# Patient Record
Sex: Female | Born: 1990 | Race: Black or African American | Hispanic: No | Marital: Single | Smoking: Former smoker
Health system: Southern US, Community
[De-identification: ages and names within clinical notes are randomized; demographics above are authoritative.]

## PROBLEM LIST (undated history)

## (undated) DIAGNOSIS — N730 Acute parametritis and pelvic cellulitis: Secondary | ICD-10-CM

## (undated) DIAGNOSIS — N870 Mild cervical dysplasia: Secondary | ICD-10-CM

## (undated) DIAGNOSIS — Z202 Contact with and (suspected) exposure to infections with a predominantly sexual mode of transmission: Secondary | ICD-10-CM

## (undated) HISTORY — DX: Mild cervical dysplasia: N87.0

## (undated) HISTORY — PX: NO PAST SURGERIES: SHX2092

---
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
## 2013-08-06 ENCOUNTER — Emergency Department (HOSPITAL_BASED_OUTPATIENT_CLINIC_OR_DEPARTMENT_OTHER)
Admission: EM | Admit: 2013-08-06 | Discharge: 2013-08-06 | Disposition: A | Discharge status: Home / Self Care | Payer: Medicaid Other | Attending: Emergency Medicine | Admitting: Emergency Medicine

## 2013-08-06 ENCOUNTER — Emergency Department (HOSPITAL_BASED_OUTPATIENT_CLINIC_OR_DEPARTMENT_OTHER): Payer: Medicaid Other

## 2013-08-06 ENCOUNTER — Encounter (HOSPITAL_BASED_OUTPATIENT_CLINIC_OR_DEPARTMENT_OTHER): Payer: Self-pay | Admitting: Emergency Medicine

## 2013-08-06 DIAGNOSIS — Y9389 Activity, other specified: Secondary | ICD-10-CM | POA: Diagnosis not present

## 2013-08-06 DIAGNOSIS — S8990XA Unspecified injury of unspecified lower leg, initial encounter: Secondary | ICD-10-CM | POA: Insufficient documentation

## 2013-08-06 DIAGNOSIS — S99919A Unspecified injury of unspecified ankle, initial encounter: Secondary | ICD-10-CM | POA: Diagnosis not present

## 2013-08-06 DIAGNOSIS — Y929 Unspecified place or not applicable: Secondary | ICD-10-CM | POA: Insufficient documentation

## 2013-08-06 DIAGNOSIS — S99929A Unspecified injury of unspecified foot, initial encounter: Principal | ICD-10-CM

## 2013-08-06 DIAGNOSIS — W450XXA Nail entering through skin, initial encounter: Secondary | ICD-10-CM

## 2013-08-06 DIAGNOSIS — IMO0002 Reserved for concepts with insufficient information to code with codable children: Secondary | ICD-10-CM | POA: Diagnosis not present

## 2013-08-06 NOTE — ED Notes (Signed)
Pt. Has noted broken R great toenail and it has lifted from the skin.  Pt. Reports stumping her toe causing the toenail to break.

## 2013-08-06 NOTE — ED Provider Notes (Signed)
CSN: [id number]     Arrival date & time 08/06/13  1230 History   First MD Initiated Contact with Patient 08/06/13 1406     Chief Complaint  Patient presents with  . Foot Pain     (Consider location/radiation/quality/duration/timing/severity/associated sxs/prior Treatment) HPI Comments: Pt states that a couple of months ago she dropped a box on her right great toe and the nail was black and blue underneath. Pt state that she hit her toe last night and the nail split half way down the nail bed. No bleeding or pain at this time. Denies drainage  The history is provided by the patient. No language interpreter was used.    History reviewed. No pertinent past medical history. History reviewed. No pertinent past surgical history. No family history on file. History  Substance Use Topics  . Smoking status: Never Smoker   . Smokeless tobacco: Not on file  . Alcohol Use: No   OB History   Grav Para Term Preterm Abortions TAB SAB Ect Mult Living                 Review of Systems  Constitutional: Negative.   Respiratory: Negative.   Cardiovascular: Negative.       Allergies  Review of patient's allergies indicates no known allergies.  Home Medications   Prior to Admission medications   Not on File   BP 119/44  Pulse 64  Temp(Src) 98.6 F (37 C) (Oral)  Resp 16  Ht 5\' 3"  (1.6 m)  Wt 147 lb (66.679 kg)  BMI 26.05 kg/m2  SpO2 100%  LMP 07/07/2013 Physical Exam  Nursing note and vitals reviewed. Constitutional: She is oriented to person, place, and time. She appears well-developed and well-nourished.  Cardiovascular: Normal rate and regular rhythm.   Pulmonary/Chest: Effort normal and breath sounds normal.  Musculoskeletal:  No drainage or redness noted to the nailbed. Pt has thick nail. Pt split horizontally about 1/2 way down  Neurological: She is alert and oriented to person, place, and time. Coordination normal.    ED Course  Procedures (including critical care  time) Labs Review Labs Reviewed - No data to display  Imaging Review Dg Toe Great Right  08/06/2013   CLINICAL DATA:  Crush injury to the right great toe 1 month ago with bruising and swelling.  EXAM: RIGHT GREAT TOE  COMPARISON:  None.  FINDINGS: No acute bony or joint abnormality is identified. No soft tissue gas collection or radiopaque foreign body is seen. The nail of the great toe appears partially detached.  IMPRESSION: Negative for acute bony or joint abnormality. The nail of the great toe appears partially detached.   Electronically Signed   By: Drusilla Kannerhomas  Dalessio M.D.   On: 08/06/2013 14:23     EKG Interpretation None      MDM   Final diagnoses:  Nail, injury by, initial encounter    Pt likely had injury to nailbed when she dropped the box on the area, making the nail bridle, no bony injury noted at this time    [name], NP 08/06/13 1430

## 2013-08-06 NOTE — ED Provider Notes (Signed)
Medical screening examination/treatment/procedure(s) were performed by non-physician practitioner and as supervising physician I was immediately available for consultation/collaboration.     Taneasha Fuqua, MD 08/06/13 1520 

## 2014-01-01 ENCOUNTER — Encounter (HOSPITAL_COMMUNITY): Payer: Self-pay | Admitting: Emergency Medicine

## 2014-01-01 ENCOUNTER — Emergency Department (HOSPITAL_COMMUNITY)
Admission: EM | Admit: 2014-01-01 | Discharge: 2014-01-01 | Disposition: A | Discharge status: Home / Self Care | Payer: Medicaid Other | Attending: Emergency Medicine | Admitting: Emergency Medicine

## 2014-01-01 DIAGNOSIS — S4992XA Unspecified injury of left shoulder and upper arm, initial encounter: Secondary | ICD-10-CM | POA: Diagnosis not present

## 2014-01-01 DIAGNOSIS — Y9389 Activity, other specified: Secondary | ICD-10-CM | POA: Insufficient documentation

## 2014-01-01 DIAGNOSIS — Y92410 Unspecified street and highway as the place of occurrence of the external cause: Secondary | ICD-10-CM | POA: Diagnosis not present

## 2014-01-01 DIAGNOSIS — S29002A Unspecified injury of muscle and tendon of back wall of thorax, initial encounter: Secondary | ICD-10-CM | POA: Insufficient documentation

## 2014-01-01 DIAGNOSIS — Y998 Other external cause status: Secondary | ICD-10-CM | POA: Diagnosis not present

## 2014-01-01 DIAGNOSIS — S199XXA Unspecified injury of neck, initial encounter: Secondary | ICD-10-CM | POA: Diagnosis not present

## 2014-01-01 DIAGNOSIS — S0990XA Unspecified injury of head, initial encounter: Secondary | ICD-10-CM | POA: Diagnosis present

## 2014-01-01 DIAGNOSIS — Z041 Encounter for examination and observation following transport accident: Secondary | ICD-10-CM

## 2014-01-01 MED ORDER — IBUPROFEN 400 MG PO TABS
800.0000 mg | ORAL_TABLET | Freq: Once | ORAL | Status: AC
  Administered 2014-01-01: 800 mg via ORAL
  Filled 2014-01-01: qty 2

## 2014-01-01 MED ORDER — METHOCARBAMOL 500 MG PO TABS: 500.0000 mg | ORAL_TABLET | Freq: Two times a day (BID) | ORAL | Status: DC

## 2014-01-01 MED ORDER — IBUPROFEN 800 MG PO TABS: 800.0000 mg | ORAL_TABLET | Freq: Three times a day (TID) | ORAL | Status: DC

## 2014-01-01 MED ORDER — DICLOFENAC SODIUM 1 % TD GEL: 2.0000 g | Freq: Four times a day (QID) | TRANSDERMAL | Status: DC
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
## 2014-04-16 DIAGNOSIS — Z8619 Personal history of other infectious and parasitic diseases: Secondary | ICD-10-CM | POA: Insufficient documentation

## 2014-08-30 ENCOUNTER — Encounter (HOSPITAL_COMMUNITY): Payer: Self-pay | Admitting: Emergency Medicine

## 2014-08-30 ENCOUNTER — Emergency Department (HOSPITAL_COMMUNITY)
Admission: EM | Admit: 2014-08-30 | Discharge: 2014-08-30 | Disposition: A | Discharge status: Home / Self Care | Payer: 59 | Attending: Emergency Medicine | Admitting: Emergency Medicine

## 2014-08-30 ENCOUNTER — Emergency Department (HOSPITAL_COMMUNITY): Payer: 59

## 2014-08-30 DIAGNOSIS — R102 Pelvic and perineal pain: Secondary | ICD-10-CM

## 2014-08-30 DIAGNOSIS — Z3202 Encounter for pregnancy test, result negative: Secondary | ICD-10-CM | POA: Diagnosis not present

## 2014-08-30 DIAGNOSIS — Z79899 Other long term (current) drug therapy: Secondary | ICD-10-CM | POA: Insufficient documentation

## 2014-08-30 DIAGNOSIS — R1030 Lower abdominal pain, unspecified: Secondary | ICD-10-CM | POA: Diagnosis present

## 2014-08-30 LAB — WET PREP, GENITAL
Trich, Wet Prep: NONE SEEN
Yeast Wet Prep HPF POC: NONE SEEN

## 2014-08-30 LAB — LIPASE, BLOOD: Lipase: 16 U/L — ABNORMAL LOW (ref 22–51)

## 2014-08-30 LAB — URINALYSIS, ROUTINE W REFLEX MICROSCOPIC
BILIRUBIN URINE: NEGATIVE
GLUCOSE, UA: NEGATIVE mg/dL
HGB URINE DIPSTICK: NEGATIVE
Ketones, ur: 15 mg/dL — AB
Leukocytes, UA: NEGATIVE
Nitrite: NEGATIVE
PH: 6 (ref 5.0–8.0)
Protein, ur: NEGATIVE mg/dL
SPECIFIC GRAVITY, URINE: 1.027 (ref 1.005–1.030)
UROBILINOGEN UA: 0.2 mg/dL (ref 0.0–1.0)

## 2014-08-30 LAB — COMPREHENSIVE METABOLIC PANEL
ALBUMIN: 3.6 g/dL (ref 3.5–5.0)
ALT: 16 U/L (ref 14–54)
ANION GAP: 10 (ref 5–15)
AST: 20 U/L (ref 15–41)
Alkaline Phosphatase: 60 U/L (ref 38–126)
BUN: 6 mg/dL (ref 6–20)
CO2: 25 mmol/L (ref 22–32)
Calcium: 9.2 mg/dL (ref 8.9–10.3)
Chloride: 103 mmol/L (ref 101–111)
Creatinine, Ser: 0.73 mg/dL (ref 0.44–1.00)
GFR calc Af Amer: >60 mL/min (ref >60)
Glucose, Bld: 93 mg/dL (ref 65–99)
POTASSIUM: 3.4 mmol/L — AB (ref 3.5–5.1)
Sodium: 138 mmol/L (ref 135–145)
Total Bilirubin: 0.5 mg/dL (ref 0.3–1.2)
Total Protein: 8.1 g/dL (ref 6.5–8.1)

## 2014-08-30 LAB — CBC
HCT: 37 % (ref 36.0–46.0)
Hemoglobin: 12.8 g/dL (ref 12.0–15.0)
MCH: 30.3 pg (ref 26.0–34.0)
MCHC: 34.6 g/dL (ref 30.0–36.0)
MCV: 87.5 fL (ref 78.0–100.0)
Platelets: 349 10*3/uL (ref 150–400)
RBC: 4.23 MIL/uL (ref 3.87–5.11)
RDW: 12.9 % (ref 11.5–15.5)
WBC: 7.8 10*3/uL (ref 4.0–10.5)

## 2014-08-30 LAB — POC URINE PREG, ED: Preg Test, Ur: NEGATIVE

## 2014-08-30 MED ORDER — KETOROLAC TROMETHAMINE 30 MG/ML IJ SOLN
15.0000 mg | Freq: Once | INTRAMUSCULAR | Status: AC
  Administered 2014-08-30: 15 mg via INTRAVENOUS
  Filled 2014-08-30: qty 1

## 2014-08-30 NOTE — Discharge Instructions (Signed)
Return to the Emergency Department in 12-24 hours for repeat abdominal exam if you develop new or worsening abdominal pain.  You can take ibuprofen, available over the counter, for pain.     Abdominal Pain, Women Abdominal (stomach, pelvic, or belly) pain can be caused by many things. It is important to tell your doctor:  The location of the pain.  Does it come and go or is it present all the time?  Are there things that start the pain (eating certain foods, exercise)?  Are there other symptoms associated with the pain (fever, nausea, vomiting, diarrhea)? All of this is helpful to know when trying to find the cause of the pain. CAUSES   Stomach: virus or bacteria infection, or ulcer.  Intestine: appendicitis (inflamed appendix), regional ileitis (Crohn's disease), ulcerative colitis (inflamed colon), irritable bowel syndrome, diverticulitis (inflamed diverticulum of the colon), or cancer of the stomach or intestine.  Gallbladder disease or stones in the gallbladder.  Kidney disease, kidney stones, or infection.  Pancreas infection or cancer.  Fibromyalgia (pain disorder).  Diseases of the female organs:  Uterus: fibroid (non-cancerous) tumors or infection.  Fallopian tubes: infection or tubal pregnancy.  Ovary: cysts or tumors.  Pelvic adhesions (scar tissue).  Endometriosis (uterus lining tissue growing in the pelvis and on the pelvic organs).  Pelvic congestion syndrome (female organs filling up with blood just before the menstrual period).  Pain with the menstrual period.  Pain with ovulation (producing an egg).  Pain with an IUD (intrauterine device, birth control) in the uterus.  Cancer of the female organs.  Functional pain (pain not caused by a disease, may improve without treatment).  Psychological pain.  Depression. DIAGNOSIS  Your doctor will decide the seriousness of your pain by doing an examination.  Blood tests.  X-rays.  Ultrasound.  CT  scan (computed tomography, special type of X-ray).  MRI (magnetic resonance imaging).  Cultures, for infection.  Barium enema (dye inserted in the large intestine, to better view it with X-rays).  Colonoscopy (looking in intestine with a lighted tube).  Laparoscopy (minor surgery, looking in abdomen with a lighted tube).  Major abdominal exploratory surgery (looking in abdomen with a large incision). TREATMENT  The treatment will depend on the cause of the pain.   Many cases can be observed and treated at home.  Over-the-counter medicines recommended by your caregiver.  Prescription medicine.  Antibiotics, for infection.  Birth control pills, for painful periods or for ovulation pain.  Hormone treatment, for endometriosis.  Nerve blocking injections.  Physical therapy.  Antidepressants.  Counseling with a psychologist or psychiatrist.  Minor or major surgery. HOME CARE INSTRUCTIONS   Do not take laxatives, unless directed by your caregiver.  Take over-the-counter pain medicine only if ordered by your caregiver. Do not take aspirin because it can cause an upset stomach or bleeding.  Try a clear liquid diet (broth or water) as ordered by your caregiver. Slowly move to a bland diet, as tolerated, if the pain is related to the stomach or intestine.  Have a thermometer and take your temperature several times a day, and record it.  Bed rest and sleep, if it helps the pain.  Avoid sexual intercourse, if it causes pain.  Avoid stressful situations.  Keep your follow-up appointments and tests, as your caregiver orders.  If the pain does not go away with medicine or surgery, you may try:  Acupuncture.  Relaxation exercises (yoga, meditation).  Group therapy.  Counseling. SEEK MEDICAL CARE IF:  You notice certain foods cause stomach pain.  Your home care treatment is not helping your pain.  You need stronger pain medicine.  You want your IUD  removed.  You feel faint or lightheaded.  You develop nausea and vomiting.  You develop a rash.  You are having side effects or an allergy to your medicine. SEEK IMMEDIATE MEDICAL CARE IF:   Your pain does not go away or gets worse.  You have a fever.  Your pain is felt only in portions of the abdomen. The right side could possibly be appendicitis. The left lower portion of the abdomen could be colitis or diverticulitis.  You are passing blood in your stools (bright red or black tarry stools, with or without vomiting).  You have blood in your urine.  You develop chills, with or without a fever.  You pass out. MAKE SURE YOU:   Understand these instructions.  Will watch your condition.  Will get help right away if you are not doing well or get worse. Document Released: 11/15/2006 Document Revised: 06/04/2013 Document Reviewed: 12/05/2008 [facility] Patient Information 2015 [place], [place]. This information is not intended to replace advice given to you by your health care provider. Make sure you discuss any questions you have with your health care provider.

## 2014-08-30 NOTE — ED Provider Notes (Signed)
CSN: [id number]     Arrival date & time 08/30/14  1410 History   First MD Initiated Contact with Patient 08/30/14 1627     Chief Complaint  Patient presents with  . Abdominal Pain     Patient is a 24 y.o. female presenting with abdominal pain. The history is provided by the patient. No language interpreter was used.  Abdominal Pain  Lorraine Williamson presents for evaluation of abdominal pain. She states that she's had lower abdominal cramping for the last 3 days. The cramping is worse with movement and urination. She denies any fevers, vomiting, diarrhea, and constipation. She does have nausea and decreased appetite. She states that the symptoms began after having intercourse. She has no vaginal discharge. Overall the symptoms have improved since they began. Symptoms are moderate, constant, worsening.  History reviewed. No pertinent past medical history. History reviewed. No pertinent past surgical history. No family history on file. History  Substance Use Topics  . Smoking status: Never Smoker   . Smokeless tobacco: Not on file  . Alcohol Use: No   OB History    No data available     Review of Systems  Gastrointestinal: Positive for abdominal pain.  All other systems reviewed and are negative.     Allergies  Review of patient's allergies indicates no known allergies.  Home Medications   Prior to Admission medications   Medication Sig Start Date End Date Taking? Authorizing Provider  acetaminophen (TYLENOL) 500 MG tablet Take 500 mg by mouth every 6 (six) hours as needed for mild pain or moderate pain.   Yes Historical Provider, MD  Multiple Vitamins-Minerals (MULTIVITAMIN & MINERAL PO) Take 1 tablet by mouth daily.   Yes Historical Provider, MD  diclofenac sodium (VOLTAREN) 1 % GEL Apply 2 g topically 4 (four) times daily. Patient not taking: Reported on 08/30/2014 01/01/14   Francee Piccolo, PA-C  ibuprofen (ADVIL,MOTRIN) 800 MG tablet Take 1 tablet (800 mg total) by mouth 3  (three) times daily. Patient not taking: Reported on 08/30/2014 01/01/14   Francee Piccolo, PA-C  methocarbamol (ROBAXIN) 500 MG tablet Take 1 tablet (500 mg total) by mouth 2 (two) times daily. Patient not taking: Reported on 08/30/2014 01/01/14   Victorino Dike Piepenbrink, PA-C   BP 130/76 mmHg  Pulse 101  Temp(Src) 98.9 F (37.2 C) (Oral)  Resp 16  Ht  (1.6 m)  Wt 159 lb (72.122 kg)  BMI 28.17 kg/m2  SpO2 100%  LMP 08/21/2014 Physical Exam  Constitutional: She is oriented to person, place, and time. She appears well-developed and well-nourished.  HENT:  Head: Normocephalic and atraumatic.  Cardiovascular: Normal rate and regular rhythm.   No murmur heard. Pulmonary/Chest: Effort normal and breath sounds normal. No respiratory distress.  Abdominal: Soft. There is no rebound and no guarding.  Moderate suprapubic and pelvic tenderness to palpation without any guarding or rebound  Genitourinary:  External vaginal/GU exam wnl without erythema or discharge.  Pt unable to tolerate speculum exam due to discomfort.  On bimanual exam she has some right adnexal tenderness, no CMT.    Musculoskeletal: She exhibits no edema or tenderness.  Neurological: She is alert and oriented to person, place, and time.  Skin: Skin is warm and dry.  Psychiatric: She has a normal mood and affect. Her behavior is normal.  Nursing note and vitals reviewed.   ED Course  Procedures (including critical care time) Labs Review Labs Reviewed  WET PREP, GENITAL - Abnormal; Notable for the following:  Clue Cells Wet Prep HPF POC FEW (*)    WBC, Wet Prep HPF POC FEW (*)    All other components within normal limits  LIPASE, BLOOD - Abnormal; Notable for the following:    Lipase 16 (*)    All other components within normal limits  COMPREHENSIVE METABOLIC PANEL - Abnormal; Notable for the following:    Potassium 3.4 (*)    All other components within normal limits  URINALYSIS, ROUTINE W REFLEX MICROSCOPIC  (NOT AT [facility]) - Abnormal; Notable for the following:    Ketones, ur 15 (*)    All other components within normal limits  CBC  POC URINE PREG, ED  GC/CHLAMYDIA PROBE AMP ([place]) NOT AT [facility]    Imaging Review US Transvaginal Non-ob  08/30/2014   CLINICAL DATA:  Midline pelvic pain, cramping for 5 days.  EXAM: TRANSABDOMINAL AND TRANSVAGINAL ULTRASOUND OF PELVIS  TECHNIQUE: Both transabdominal and transvaginal ultrasound examinations of the pelvis were performed. Transabdominal technique was performed for global imaging of the pelvis including uterus, ovaries, adnexal regions, and pelvic cul-de-sac. It was necessary to proceed with endovaginal exam following the transabdominal exam to visualize the uterus, endometrium, ovaries and adnexa .  COMPARISON:  None  FINDINGS: Uterus  Measurements: 5.7 x 3.1 x 4.2 cm. No fibroids or other mass visualized.  Endometrium  Thickness: 4 mm.  No focal abnormality visualized.  Right ovary  Measurements: 2.7 x 2.8 x 3.3 cm. Normal appearance/no adnexal mass. Small follicles.  Left ovary  Measurements: 2.9 x 1.8 x 1.7 cm. Normal appearance/no adnexal mass. Small follicles.  Other findings  No free fluid.  IMPRESSION: Unremarkable pelvic ultrasound.   Electronically Signed   By: Charlett Nose M.D.   On: 08/30/2014 19:40   US Pelvis Complete  08/30/2014   CLINICAL DATA:  Midline pelvic pain, cramping for 5 days.  EXAM: TRANSABDOMINAL AND TRANSVAGINAL ULTRASOUND OF PELVIS  TECHNIQUE: Both transabdominal and transvaginal ultrasound examinations of the pelvis were performed. Transabdominal technique was performed for global imaging of the pelvis including uterus, ovaries, adnexal regions, and pelvic cul-de-sac. It was necessary to proceed with endovaginal exam following the transabdominal exam to visualize the uterus, endometrium, ovaries and adnexa .  COMPARISON:  None  FINDINGS: Uterus  Measurements: 5.7 x 3.1 x 4.2 cm. No fibroids or other mass visualized.  Endometrium   Thickness: 4 mm.  No focal abnormality visualized.  Right ovary  Measurements: 2.7 x 2.8 x 3.3 cm. Normal appearance/no adnexal mass. Small follicles.  Left ovary  Measurements: 2.9 x 1.8 x 1.7 cm. Normal appearance/no adnexal mass. Small follicles.  Other findings  No free fluid.  IMPRESSION: Unremarkable pelvic ultrasound.   Electronically Signed   By: Charlett Nose M.D.   On: 08/30/2014 19:40     EKG Interpretation None      MDM   Final diagnoses:  Pelvic pain in female    Patient here for evaluation of pelvic pain. Examination is not consistent with PID or tubo-ovarian abscess. Pelvic ultrasound without any evidence of ovarian cyst or torsion. Examination and history is not consistent with appendicitis. Discussed with patient home care for pelvic pain with ibuprofen. Recommend close return precautions for repeat abdominal examination.   Tilden Fossa, MD 08/30/14 2120

## 2014-08-30 NOTE — ED Notes (Signed)
Onset 4-5 LLQ and RLQ abdominal pain states when urinating feels like having contractions LMP July 20 through July 23.

## 2014-09-02 LAB — GC/CHLAMYDIA PROBE AMP (~~LOC~~) NOT AT ARMC
CHLAMYDIA, DNA PROBE: POSITIVE — AB
NEISSERIA GONORRHEA: NEGATIVE

## 2014-09-04 ENCOUNTER — Telehealth (HOSPITAL_COMMUNITY): Payer: Self-pay

## 2014-09-04 ENCOUNTER — Emergency Department (HOSPITAL_BASED_OUTPATIENT_CLINIC_OR_DEPARTMENT_OTHER)
Admission: EM | Admit: 2014-09-04 | Discharge: 2014-09-04 | Disposition: A | Discharge status: Home / Self Care | Payer: 59 | Attending: Emergency Medicine | Admitting: Emergency Medicine

## 2014-09-04 ENCOUNTER — Encounter (HOSPITAL_BASED_OUTPATIENT_CLINIC_OR_DEPARTMENT_OTHER): Payer: Self-pay | Admitting: *Deleted

## 2014-09-04 DIAGNOSIS — Z79899 Other long term (current) drug therapy: Secondary | ICD-10-CM | POA: Diagnosis not present

## 2014-09-04 DIAGNOSIS — R1030 Lower abdominal pain, unspecified: Secondary | ICD-10-CM | POA: Diagnosis present

## 2014-09-04 DIAGNOSIS — A7489 Other chlamydial diseases: Secondary | ICD-10-CM | POA: Insufficient documentation

## 2014-09-04 DIAGNOSIS — R630 Anorexia: Secondary | ICD-10-CM | POA: Diagnosis not present

## 2014-09-04 DIAGNOSIS — A749 Chlamydial infection, unspecified: Secondary | ICD-10-CM

## 2014-09-04 MED ORDER — CEFTRIAXONE SODIUM 250 MG IJ SOLR
250.0000 mg | Freq: Once | INTRAMUSCULAR | Status: AC
  Administered 2014-09-04: 250 mg via INTRAMUSCULAR
  Filled 2014-09-04: qty 250

## 2014-09-04 MED ORDER — AZITHROMYCIN 250 MG PO TABS
1000.0000 mg | ORAL_TABLET | Freq: Once | ORAL | Status: AC
  Administered 2014-09-04: 1000 mg via ORAL
  Filled 2014-09-04: qty 4

## 2014-09-04 MED ORDER — DOXYCYCLINE HYCLATE 100 MG PO CAPS: 100.0000 mg | ORAL_CAPSULE | Freq: Two times a day (BID) | ORAL | Status: DC

## 2014-09-04 MED ORDER — LIDOCAINE HCL (PF) 1 % IJ SOLN
INTRAMUSCULAR | Status: AC
  Administered 2014-09-04: 1.2 mL
  Filled 2014-09-04: qty 5

## 2014-09-04 MED ORDER — FLUCONAZOLE 50 MG PO TABS
150.0000 mg | ORAL_TABLET | Freq: Once | ORAL | Status: AC
  Administered 2014-09-04: 150 mg via ORAL
  Filled 2014-09-04 (×2): qty 1

## 2014-09-04 NOTE — ED Provider Notes (Signed)
CSN: [id number]     Arrival date & time 09/04/14  1741 History   First MD Initiated Contact with Patient 09/04/14 1749     Chief Complaint  Patient presents with  . Abdominal Pain     (Consider location/radiation/quality/duration/timing/severity/associated sxs/prior Treatment) HPI Comments: 24 year old female complaining of continued lower abdominal pain since being seen in the ED on 08/30/2014. No new pain or symptoms, nothing worsening, just states it has not gone away despite taking ibuprofen which provides only temporary relief. About 8 days ago, pt started experiencing lower abdominal pain that began after having intercourse. She has not had intercourse since. While in the ER, she had a pelvic exam that was very painful with insertion of the speculum, a normal pelvic ultrasound and no concerns for PID. It is noted on chart review that her chlamydia culture is positive that was obtained at that visit. Patient has not yet been contacted about this. Denies vaginal bleeding or discharge. Endorses decreased appetite, was able to eat a piece of pizza today. Denies fever, chills, nausea, vomiting or diarrhea. Reports having one current sexual partner that she uses protection with, about one month ago, had unprotected intercourse with a new partner.  Patient is a 24 y.o. female presenting with abdominal pain. The history is provided by the patient and medical records.  Abdominal Pain   History reviewed. No pertinent past medical history. History reviewed. No pertinent past surgical history. No family history on file. History  Substance Use Topics  . Smoking status: Never Smoker   . Smokeless tobacco: Not on file  . Alcohol Use: No   OB History    No data available     Review of Systems  Constitutional: Positive for appetite change.  Gastrointestinal: Positive for abdominal pain.  All other systems reviewed and are negative.     Allergies  Review of patient's allergies indicates no  known allergies.  Home Medications   Prior to Admission medications   Medication Sig Start Date End Date Taking? Authorizing Provider  acetaminophen (TYLENOL) 500 MG tablet Take 500 mg by mouth every 6 (six) hours as needed for mild pain or moderate pain.    Historical Provider, MD  diclofenac sodium (VOLTAREN) 1 % GEL Apply 2 g topically 4 (four) times daily. Patient not taking: Reported on 08/30/2014 01/01/14   Francee Piccolo, PA-C  doxycycline (VIBRAMYCIN) 100 MG capsule Take 1 capsule (100 mg total) by mouth 2 (two) times daily. 09/04/14   Kathrynn Speed, PA-C  ibuprofen (ADVIL,MOTRIN) 800 MG tablet Take 1 tablet (800 mg total) by mouth 3 (three) times daily. Patient not taking: Reported on 08/30/2014 01/01/14   Francee Piccolo, PA-C  methocarbamol (ROBAXIN) 500 MG tablet Take 1 tablet (500 mg total) by mouth 2 (two) times daily. Patient not taking: Reported on 08/30/2014 01/01/14   Francee Piccolo, PA-C  Multiple Vitamins-Minerals (MULTIVITAMIN & MINERAL PO) Take 1 tablet by mouth daily.    Historical Provider, MD   BP 120/68 mmHg  Pulse 110  Temp(Src) 98.6 F (37 C) (Oral)  Resp 18  Ht  (1.6 m)  Wt 155 lb (70.308 kg)  BMI 27.46 kg/m2  SpO2 98%  LMP 08/21/2014 Physical Exam  Constitutional: She is oriented to person, place, and time. She appears well-developed and well-nourished. No distress.  HENT:  Head: Normocephalic and atraumatic.  Mouth/Throat: Oropharynx is clear and moist.  Eyes: Conjunctivae and EOM are normal.  Neck: Normal range of motion. Neck supple.  Cardiovascular: Normal rate, regular  rhythm and normal heart sounds.   Pulmonary/Chest: Effort normal and breath sounds normal. No respiratory distress.  Abdominal: Soft. Bowel sounds are normal. She exhibits no distension. There is no rebound and no guarding.  Mild suprapubic tenderness. No peritoneal signs.  Genitourinary:  Deferred.  Musculoskeletal: Normal range of motion. She exhibits no edema.   Neurological: She is alert and oriented to person, place, and time. No sensory deficit.  Skin: Skin is warm and dry.  Psychiatric: She has a normal mood and affect. Her behavior is normal.  Nursing note and vitals reviewed.   ED Course  Procedures (including critical care time) Labs Review Labs Reviewed - No data to display  Imaging Review No results found.   EKG Interpretation None      MDM   Final diagnoses:  Chlamydia  Lower abdominal pain   Non-toxic appearing, NAD. AFVSS. Noted to be tachycardic on arrival, no tachycardia on my exam. Abdomen is soft with no peritoneal signs. Already had pelvic exam with cultures along with normal pelvic US at last visit. No new symptoms or worsening pain. Discussed positive chlamydia with pt. Will treat with rocephin/azithro in ED, d/c home with 14 days of doxy. Safe sexual practices discussed. Pain most likely from developing PID from chlamydia. Infection/care precautions discussed. F/u with GYN. Stable for d/c. Return precautions given. Patient states understanding of treatment care plan and is agreeable.  Kathrynn Speed, PA-C 09/04/14 1821  Raeford Razor, MD 09/04/14 2037

## 2014-09-04 NOTE — ED Notes (Signed)
Pt c/o lower abd pain and no appetite x7 days. Pt was seen at [facility] ER on 7/30 and was told to f/u with the ER if not feeling better. Pt also c/o HA.

## 2014-09-04 NOTE — Telephone Encounter (Signed)
Results received from [facility].  (+) Chlamydia. No antibiotic treatment or Prescription given for STD.  Chart to MD office for review.  [facility]HS form attached. 

## 2014-09-04 NOTE — Discharge Instructions (Signed)
Take doxycycline twice daily for 14 days. It is important to complete the entire course of antibiotic. No sexual intercourse for 10 days after completing antibiotics. Your obligated to inform your partners so they can be treated for chlamydia.  Chlamydia Chlamydia is an infection. It is spread through sexual contact. Chlamydia can be in different areas of the body. These areas include the cervix, urethra, throat, or rectum. You may not know you have chlamydia because many people never develop the symptoms. Chlamydia is not difficult to treat once you know you have it. However, if it is left untreated, chlamydia can lead to more serious health problems.  CAUSES  Chlamydia is caused by bacteria. It is a sexually transmitted disease. It is passed from an infected partner during intimate contact. This contact could be with the genitals, mouth, or rectal area. Chlamydia can also be passed from mothers to babies during birth. SIGNS AND SYMPTOMS  There may not be any symptoms. This is often the case early in the infection. If symptoms develop, they may include:  Mild pain and discomfort when urinating.  Redness, soreness, and swelling (inflammation) of the rectum.  Vaginal discharge.  Painful intercourse.  Abdominal pain.  Bleeding between menstrual periods. DIAGNOSIS  To diagnose this infection, your health care provider will do a pelvic exam. Cultures will be taken of the vagina, cervix, urine, and possibly the rectum to verify the diagnosis.  TREATMENT You will be given antibiotic medicines. If you are pregnant, certain types of antibiotics will need to be avoided. Any sexual partners should also be treated, even if they do not show symptoms.  HOME CARE INSTRUCTIONS   Take your antibiotic medicine as directed by your health care provider. Finish the antibiotic even if you start to feel better.  Take medicines only as directed by your health care provider.  Inform any sexual partners about  the infection. They should also be treated.  Do not have sexual contact until your health care provider tells you it is okay.  Get plenty of rest.  Eat a well-balanced diet.  Drink enough fluids to keep your urine clear or pale yellow.  Keep all follow-up visits as directed by your health care provider. SEEK MEDICAL CARE IF:  You have painful urination.  You have abdominal pain.  You have vaginal discharge.  You have painful sexual intercourse.  You have bleeding between periods and after sex.  You have a fever. SEEK IMMEDIATE MEDICAL CARE IF:   You experience nausea or vomiting.  You experience excessive sweating (diaphoresis).  You have difficulty swallowing. MAKE SURE YOU:   Understand these instructions.  Will watch your condition.  Will get help right away if you are not doing well or get worse. Document Released: 10/28/2004 Document Revised: 06/04/2013 Document Reviewed: 09/25/2012 [facility] Patient Information 2015 [place], [place]. This information is not intended to replace advice given to you by your health care provider. Make sure you discuss any questions you have with your health care provider. Pelvic Inflammatory Disease Pelvic inflammatory disease (PID) refers to an infection in some or all of the female organs. The infection can be in the uterus, ovaries, fallopian tubes, or the surrounding tissues in the pelvis. PID can cause abdominal or pelvic pain that comes on suddenly (acute pelvic pain). PID is a serious infection because it can lead to lasting (chronic) pelvic pain or the inability to have children (infertile).  CAUSES  The infection is often caused by the normal bacteria found in the vaginal  tissues. PID may also be caused by an infection that is spread during sexual contact. PID can also occur following:   The birth of a baby.   A miscarriage.   An abortion.   Major pelvic surgery.   The use of an intrauterine device (IUD).   A  sexual assault.  RISK FACTORS Certain factors can put a person at higher risk for PID, such as:  Being younger than 25 years.  Being sexually active at [place] age.  Usingnonbarrier contraception.  Havingmultiple sexual partners.  Having sex with someone who has symptoms of a genital infection.  Using oral contraception. Other times, certain behaviors can increase the possibility of getting PID, such as:  Having sex during your period.  Using a vaginal douche.  Having an intrauterine device (IUD) in place. SYMPTOMS   Abdominal or pelvic pain.   Fever.   Chills.   Abnormal vaginal discharge.  Abnormal uterine bleeding.   Unusual pain shortly after finishing your period. DIAGNOSIS  Your caregiver will choose some of the following methods to make a diagnosis, such as:   Performinga physical exam and history. A pelvic exam typically reveals a very tender uterus and surrounding pelvis.   Ordering laboratory tests including a pregnancy test, blood tests, and urine test.  Orderingcultures of the vagina and cervix to check for a sexually transmitted infection (STI).  Performing an ultrasound.   Performing a laparoscopic procedure to look inside the pelvis.  TREATMENT   Antibiotic medicines may be prescribed and taken by mouth.   Sexual partners may be treated when the infection is caused by a sexually transmitted disease (STD).   Hospitalization may be needed to give antibiotics intravenously.  Surgery may be needed, but this is rare. It may take weeks until you are completely well. If you are diagnosed with PID, you should also be checked for human immunodeficiency virus (HIV). HOME CARE INSTRUCTIONS   If given, take your antibiotics as directed. Finish the medicine even if you start to feel better.   Only take over-the-counter or prescription medicines for pain, discomfort, or fever as directed by your caregiver.   Do not have sexual  intercourse until treatment is completed or as directed by your caregiver. If PID is confirmed, your recent sexual partner(s) will need treatment.   Keep your follow-up appointments. SEEK MEDICAL CARE IF:   You have increased or abnormal vaginal discharge.   You need prescription medicine for your pain.   You vomit.   You cannot take your medicines.   Your partner has an STD.  SEEK IMMEDIATE MEDICAL CARE IF:   You have a fever.   You have increased abdominal or pelvic pain.   You have chills.   You have pain when you urinate.   You are not better after 72 hours following treatment.  MAKE SURE YOU:   Understand these instructions.  Will watch your condition.  Will get help right away if you are not doing well or get worse. Document Released: 01/18/2005 Document Revised: 05/15/2012 Document Reviewed: 01/14/2011 [facility] Patient Information 2015 [place], [place]. This information is not intended to replace advice given to you by your health care provider. Make sure you discuss any questions you have with your health care provider.

## 2014-10-04 ENCOUNTER — Ambulatory Visit: Payer: 59 | Admitting: Obstetrics and Gynecology

## 2014-10-04 ENCOUNTER — Encounter: Payer: Self-pay | Admitting: Obstetrics and Gynecology

## 2014-10-04 NOTE — Progress Notes (Deleted)
   Subjective:    Patient ID: Lorraine Williamson, female    DOB: August 11, 1990, 24 y.o.   MRN: [id number]  HPI 24 yo presenting today as an ED follow up from 09/04/2014 for which she was treated for PID. Patient reports ***  History reviewed. No pertinent past medical history. No past surgical history on file. No family history on file. Social History  Substance Use Topics  . Smoking status: Never Smoker   . Smokeless tobacco: None  . Alcohol Use: No      Review of Systems See pertinent in HPI    Objective:   Physical Exam  GENERAL: Well-developed, well-nourished female in no acute distress.  ABDOMEN: Soft, nontender, nondistended. No organomegaly. PELVIC: Normal external female genitalia. Vagina is pink and rugated.  Normal discharge. Normal appearing cervix. Uterus is normal in size. No adnexal mass or tenderness. EXTREMITIES: No cyanosis, clubbing, or edema, 2+ distal pulses.       Assessment & Plan:  A/P 24 yo recently treated for PID here for follow up -

## 2014-10-04 NOTE — Progress Notes (Signed)
Patient ID: Lorraine Williamson, female   DOB: 07/24/1990, 24 y.o.   MRN: [id number] Patient was not seen by a provider today

## 2014-10-24 ENCOUNTER — Ambulatory Visit (INDEPENDENT_AMBULATORY_CARE_PROVIDER_SITE_OTHER): Payer: 59 | Admitting: Family Medicine

## 2014-10-24 ENCOUNTER — Encounter: Payer: Self-pay | Admitting: Family Medicine

## 2014-10-24 VITALS — BP 127/74 | HR 90 | Temp 98.9°F | Ht 64.0 in | Wt 162.7 lb

## 2014-10-24 DIAGNOSIS — Z113 Encounter for screening for infections with a predominantly sexual mode of transmission: Secondary | ICD-10-CM | POA: Diagnosis not present

## 2014-10-24 DIAGNOSIS — Z202 Contact with and (suspected) exposure to infections with a predominantly sexual mode of transmission: Secondary | ICD-10-CM | POA: Diagnosis not present

## 2014-10-24 DIAGNOSIS — Z118 Encounter for screening for other infectious and parasitic diseases: Secondary | ICD-10-CM | POA: Diagnosis not present

## 2014-10-24 NOTE — Progress Notes (Signed)
Lorraine Williamson here to assure CT resolved.  Patient was here for test of cure and was not seen by MD. I spoke with RN about the plan.  Federico Flake, MD

## 2014-10-25 LAB — URINE CYTOLOGY ANCILLARY ONLY
Chlamydia: NEGATIVE
Neisseria Gonorrhea: NEGATIVE

## 2014-11-05 ENCOUNTER — Telehealth: Payer: Self-pay | Admitting: *Deleted

## 2014-11-05 NOTE — Telephone Encounter (Signed)
Pt called requesting results of gc/ch. Informed patient of normal results. She had no further questions.

## 2015-03-12 ENCOUNTER — Ambulatory Visit (INDEPENDENT_AMBULATORY_CARE_PROVIDER_SITE_OTHER): Payer: Self-pay | Admitting: General Practice

## 2015-03-12 DIAGNOSIS — Z113 Encounter for screening for infections with a predominantly sexual mode of transmission: Secondary | ICD-10-CM

## 2015-03-13 LAB — GC/CHLAMYDIA PROBE AMP (~~LOC~~) NOT AT ARMC
Chlamydia: NEGATIVE
Neisseria Gonorrhea: NEGATIVE

## 2015-03-19 ENCOUNTER — Telehealth: Payer: Self-pay | Admitting: *Deleted

## 2015-03-19 NOTE — Telephone Encounter (Signed)
Pt called requesting std testing results. Called patient back and informed her of her results. She requested to make an appointment to be seen for followup because she thinks she might have an infection. Advised patient that i will send a message to front office staff.

## 2015-04-16 ENCOUNTER — Encounter: Payer: Self-pay | Admitting: Family Medicine

## 2015-04-16 ENCOUNTER — Ambulatory Visit (INDEPENDENT_AMBULATORY_CARE_PROVIDER_SITE_OTHER): Payer: BLUE CROSS/BLUE SHIELD | Admitting: Family Medicine

## 2015-04-16 VITALS — BP 109/71 | HR 92 | Temp 98.7°F | Wt 181.3 lb

## 2015-04-16 DIAGNOSIS — Z8619 Personal history of other infectious and parasitic diseases: Secondary | ICD-10-CM | POA: Diagnosis not present

## 2015-04-16 DIAGNOSIS — N760 Acute vaginitis: Secondary | ICD-10-CM | POA: Diagnosis not present

## 2015-04-16 DIAGNOSIS — A499 Bacterial infection, unspecified: Secondary | ICD-10-CM | POA: Diagnosis not present

## 2015-04-16 DIAGNOSIS — Z113 Encounter for screening for infections with a predominantly sexual mode of transmission: Secondary | ICD-10-CM

## 2015-04-16 DIAGNOSIS — B9689 Other specified bacterial agents as the cause of diseases classified elsewhere: Secondary | ICD-10-CM

## 2015-04-16 NOTE — Patient Instructions (Signed)

## 2015-04-16 NOTE — Progress Notes (Signed)
Patient ID: Lorraine Williamson, female   DOB: 04/24/1990, 25 y.o.   MRN: [id number]   CLINIC ENCOUNTER NOTE  History:  25 y.o. No obstetric history on file. here today for vaginal discomfort. Reports feeling pressure. Mild odor. Reports chlamydia 1 year ago.  Reports vaginal dryness. No new partners and sexually active with only one partner. She denies any abnormal vaginal discharge, bleeding, pelvic pain or other concerns.   No past medical history on file.  No past surgical history on file.  The following portions of the patient's history were reviewed and updated as appropriate: allergies, current medications, past family history, past medical history, past social history, past surgical history and problem list.   Health Maintenance:  Needs pap smear.   Review of Systems:  Pertinent items noted in HPI and remainder of comprehensive ROS otherwise negative.  Objective:  Physical Exam BP 109/71 mmHg  Pulse 92  Temp(Src) 98.7 F (37.1 C)  Wt 181 lb 4.8 oz (82.237 kg) CONSTITUTIONAL: Well-developed, well-nourished female in no acute distress.  HENT:  Normocephalic, atraumatic. External right and left ear normal. Oropharynx is clear and moist EYES: Conjunctivae and EOM are normal. Pupils are equal, round, and reactive to light. No scleral icterus.  NECK: Normal range of motion, supple, no masses SKIN: Skin is warm and dry. No rash noted. Not diaphoretic. No erythema. No pallor. NEUROLGIC: Alert and oriented to person, place, and time. Normal reflexes, muscle tone coordination. No cranial nerve deficit noted. PSYCHIATRIC: Normal mood and affect. Normal behavior. Normal judgment and thought content. CARDIOVASCULAR: Normal heart rate noted RESPIRATORY: Effort and breath sounds normal, no problems with respiration noted ABDOMEN: Soft, no distention noted.   PELVIC: Normal appearing external genitalia; normal appearing vaginal mucosa Patient id not tolerate speculum insertion and wet prep and  GC/CT were collected blindly.  MUSCULOSKELETAL: Normal range of motion. No edema noted.  Labs and Imaging No results found.  Assessment & Plan:   #vaginal discharge - collected cultures today, follow up results. Most likely BV - Sent in metronidazole given results of wet prep  #HCM - needs pap smear and declined this today given discomfort with speculum placement.   Routine preventative health maintenance measures emphasized. Please refer to After Visit Summary for other counseling recommendations.   Total face-to-face time with patient: 25 minutes. Over 50% of encounter was spent on counseling and coordination of care.

## 2015-04-17 LAB — WET PREP, GENITAL
Trich, Wet Prep: NONE SEEN
Yeast Wet Prep HPF POC: NONE SEEN

## 2015-04-17 LAB — GC/CHLAMYDIA PROBE AMP (~~LOC~~) NOT AT ARMC
Chlamydia: NEGATIVE
NEISSERIA GONORRHEA: NEGATIVE

## 2015-04-17 MED ORDER — METRONIDAZOLE 500 MG PO TABS: 500.0000 mg | ORAL_TABLET | Freq: Two times a day (BID) | ORAL | Status: DC

## 2015-04-18 ENCOUNTER — Telehealth: Payer: Self-pay | Admitting: General Practice

## 2015-04-18 NOTE — Telephone Encounter (Signed)
Per Dr Alvester MorinNewton, patient has BV and needs flagyl sent to pharmacy. Called patient & informed her of results and medication sent to pharmacy. Patient verbalized understanding & had no questions

## 2015-08-20 ENCOUNTER — Encounter (HOSPITAL_COMMUNITY): Payer: Self-pay | Admitting: *Deleted

## 2015-08-20 ENCOUNTER — Telehealth: Payer: Self-pay | Admitting: *Deleted

## 2015-08-20 ENCOUNTER — Inpatient Hospital Stay (HOSPITAL_COMMUNITY)
Admission: AD | Admit: 2015-08-20 | Discharge: 2015-08-20 | Disposition: A | Discharge status: Home / Self Care | Payer: BLUE CROSS/BLUE SHIELD | Source: Ambulatory Visit | Attending: Obstetrics & Gynecology | Admitting: Obstetrics & Gynecology

## 2015-08-20 DIAGNOSIS — Z113 Encounter for screening for infections with a predominantly sexual mode of transmission: Secondary | ICD-10-CM

## 2015-08-20 DIAGNOSIS — R110 Nausea: Secondary | ICD-10-CM | POA: Diagnosis not present

## 2015-08-20 DIAGNOSIS — N760 Acute vaginitis: Secondary | ICD-10-CM | POA: Diagnosis not present

## 2015-08-20 DIAGNOSIS — N739 Female pelvic inflammatory disease, unspecified: Secondary | ICD-10-CM | POA: Diagnosis not present

## 2015-08-20 DIAGNOSIS — Z114 Encounter for screening for human immunodeficiency virus [HIV]: Secondary | ICD-10-CM | POA: Diagnosis not present

## 2015-08-20 DIAGNOSIS — N898 Other specified noninflammatory disorders of vagina: Secondary | ICD-10-CM | POA: Diagnosis present

## 2015-08-20 DIAGNOSIS — Z3202 Encounter for pregnancy test, result negative: Secondary | ICD-10-CM | POA: Insufficient documentation

## 2015-08-20 DIAGNOSIS — Z8619 Personal history of other infectious and parasitic diseases: Secondary | ICD-10-CM | POA: Diagnosis not present

## 2015-08-20 HISTORY — DX: Contact with and (suspected) exposure to infections with a predominantly sexual mode of transmission: Z20.2

## 2015-08-20 HISTORY — DX: Acute parametritis and pelvic cellulitis: N73.0

## 2015-08-20 LAB — URINALYSIS, ROUTINE W REFLEX MICROSCOPIC
BILIRUBIN URINE: NEGATIVE
GLUCOSE, UA: NEGATIVE mg/dL
Hgb urine dipstick: NEGATIVE
KETONES UR: NEGATIVE mg/dL
Leukocytes, UA: NEGATIVE
Nitrite: NEGATIVE
PH: 5.5 (ref 5.0–8.0)
PROTEIN: NEGATIVE mg/dL
Specific Gravity, Urine: <1.005 — ABNORMAL LOW (ref 1.005–1.030)

## 2015-08-20 LAB — WET PREP, GENITAL
CLUE CELLS WET PREP: NONE SEEN
SPERM: NONE SEEN
TRICH WET PREP: NONE SEEN
YEAST WET PREP: NONE SEEN

## 2015-08-20 LAB — CBC
HCT: 38.6 % (ref 36.0–46.0)
Hemoglobin: 13.3 g/dL (ref 12.0–15.0)
MCH: 30.4 pg (ref 26.0–34.0)
MCHC: 34.5 g/dL (ref 30.0–36.0)
MCV: 88.1 fL (ref 78.0–100.0)
Platelets: 373 K/uL (ref 150–400)
RBC: 4.38 MIL/uL (ref 3.87–5.11)
RDW: 13 % (ref 11.5–15.5)
WBC: 4.6 K/uL (ref 4.0–10.5)

## 2015-08-20 LAB — POCT PREGNANCY, URINE: Preg Test, Ur: NEGATIVE

## 2015-08-20 MED ORDER — NYSTATIN-TRIAMCINOLONE 100000-0.1 UNIT/GM-% EX CREA: TOPICAL_CREAM | CUTANEOUS | Status: DC

## 2015-08-20 NOTE — Discharge Instructions (Signed)

## 2015-08-20 NOTE — MAU Note (Signed)
Pt presents to MAU with complaints of itching with some vaginal bleeding yesterday after intercourse with partner on July the 16th. Noticed swelling and irritation this morning on her vaginal area. Reports previous PID approximately a year ago

## 2015-08-20 NOTE — Telephone Encounter (Signed)
See note for phone encounter. 

## 2015-08-20 NOTE — MAU Provider Note (Signed)
History     CSN: [id number]  Arrival date and time: 08/20/15 7829   First Provider Initiated Contact with Patient 08/20/15 1043      Chief Complaint  Patient presents with  . vaginal irritation   . Vaginal Itching   HPI  Lorraine Williamson is a 25 y.o. G0P0000 female who presents for vaginal irritation. PMH significant for chlamydia & PID last year. Pt recently had intercourse with same partner who gave her chlamydia and is now concerned that she has chlamydia and/or PID again. Last intercourse 7/16; reports some pink spotting after intercourse, none since then. Reports vaginal itching & irritation x 3 days. Denies vaginal bleeding, vaginal discharge, fever/chills, abdominal pain, or dysuria.  Requesting STD testing today.   OB History    Gravida Para Term Preterm AB TAB SAB Ectopic Multiple Living        Past Medical History  Diagnosis Date  . Chlamydia contact, treated   . PID (acute pelvic inflammatory disease)     Past Surgical History  Procedure Laterality Date  . No past surgeries      No family history on file.  Social History  Substance Use Topics  . Smoking status: Never Smoker   . Smokeless tobacco: Not on file  . Alcohol Use: No    Allergies: No Known Allergies  Prescriptions prior to admission  Medication Sig Dispense Refill Last Dose  . acetaminophen (TYLENOL) 500 MG tablet Take 500 mg by mouth every 6 (six) hours as needed for mild pain or moderate pain. Reported on 04/16/2015   Not Taking  . diclofenac sodium (VOLTAREN) 1 % GEL Apply 2 g topically 4 (four) times daily. (Patient not taking: Reported on 08/30/2014) 100 g 0 Not Taking  . doxycycline (VIBRAMYCIN) 100 MG capsule Take 1 capsule (100 mg total) by mouth 2 (two) times daily. (Patient not taking: Reported on 10/24/2014) 28 capsule 0 Not Taking  . ibuprofen (ADVIL,MOTRIN) 800 MG tablet Take 1 tablet (800 mg total) by mouth 3 (three) times daily. (Patient not taking: Reported on  08/30/2014) 21 tablet 0 Not Taking  . methocarbamol (ROBAXIN) 500 MG tablet Take 1 tablet (500 mg total) by mouth 2 (two) times daily. (Patient not taking: Reported on 08/30/2014) 20 tablet 0 Not Taking  . metroNIDAZOLE (FLAGYL) 500 MG tablet Take 1 tablet (500 mg total) by mouth 2 (two) times daily. 14 tablet 0   . Multiple Vitamins-Minerals (MULTIVITAMIN & MINERAL PO) Take 1 tablet by mouth daily. Reported on 04/16/2015   Taking    Review of Systems  Constitutional: Negative for fever and chills.  Gastrointestinal: Positive for nausea. Negative for vomiting, abdominal pain, diarrhea and constipation.  Genitourinary: Negative for dysuria.       + vaginal irritation & itching No vaginal bleeding or discharge   Physical Exam   Blood pressure 113/71, pulse 58, temperature 98.6 F (37 C), resp. rate 18, weight 168 lb (76.204 kg), last menstrual period 08/11/2015.  Physical Exam  Nursing note and vitals reviewed. Constitutional: She is oriented to person, place, and time. She appears well-developed and well-nourished. No distress.  HENT:  Head: Normocephalic and atraumatic.  Eyes: Conjunctivae are normal. Right eye exhibits no discharge. Left eye exhibits no discharge. No scleral icterus.  Neck: Normal range of motion.  Respiratory: Effort normal. No respiratory distress.  GI: Soft. She exhibits no distension. There is no tenderness.  Genitourinary: Uterus normal. Cervix exhibits no motion  tenderness and no friability. Right adnexum displays no mass, no tenderness and no fullness. Left adnexum displays no mass, no tenderness and no fullness. There is erythema in the vagina. No bleeding in the vagina. Vaginal discharge (small amount of tan mucoid discharge) found.  Bilateral labia minora erythematous  Neurological: She is alert and oriented to person, place, and time.  Skin: Skin is warm and dry. She is not diaphoretic.  Psychiatric: She has a normal mood and affect. Her behavior is normal.  Judgment and thought content normal.    MAU Course  Procedures Results for orders placed or performed during the hospital encounter of 08/20/15 (from the past 24 hour(s))  Urinalysis, Routine w reflex microscopic ([facility])     Status: Abnormal   Collection Time: 08/20/15 10:00 AM  Result Value Ref Range   Color, Urine YELLOW YELLOW   APPearance CLEAR CLEAR   Specific Gravity, Urine <1.005 (L) 1.005 - 1.030   pH 5.5 5.0 - 8.0   Glucose, UA NEGATIVE NEGATIVE mg/dL   Hgb urine dipstick NEGATIVE NEGATIVE   Bilirubin Urine NEGATIVE NEGATIVE   Ketones, ur NEGATIVE NEGATIVE mg/dL   Protein, ur NEGATIVE NEGATIVE mg/dL   Nitrite NEGATIVE NEGATIVE   Leukocytes, UA NEGATIVE NEGATIVE  Pregnancy, urine POC     Status: None   Collection Time: 08/20/15 10:15 AM  Result Value Ref Range   Preg Test, Ur NEGATIVE NEGATIVE  Wet prep, genital     Status: Abnormal   Collection Time: 08/20/15 11:10 AM  Result Value Ref Range   Yeast Wet Prep HPF POC NONE SEEN NONE SEEN   Trich, Wet Prep NONE SEEN NONE SEEN   Clue Cells Wet Prep HPF POC NONE SEEN NONE SEEN   WBC, Wet Prep HPF POC FEW (A) NONE SEEN   Sperm NONE SEEN   CBC     Status: None   Collection Time: 08/20/15 11:19 AM  Result Value Ref Range   WBC 4.6 4.0 - 10.5 K/uL   RBC 4.38 3.87 - 5.11 MIL/uL   Hemoglobin 13.3 12.0 - 15.0 g/dL   HCT 54.038.6 98.136.0 - 19.146.0 %   MCV 88.1 78.0 - 100.0 fL   MCH 30.4 26.0 - 34.0 pg   MCHC 34.5 30.0 - 36.0 g/dL   RDW 47.813.0 29.511.5 - 62.115.5 %   Platelets 373 150 - 400 K/uL    MDM UPT negative No evidence of PID today CBC, HIV, RPR, GC/CT, wet prep  Assessment and Plan  A: 1. Screening for STD (sexually transmitted disease)   2. Vulvovaginitis     P: Discharge home Rx mycolog cream GC/CT, HIV, RPR pending   Judeth Hornrin Sharita Bienaime 08/20/2015, 10:22 AM

## 2015-08-20 NOTE — MAU Provider Note (Signed)
MAU SOAP NOTE  Subjective - CC: Lorraine Williamson is a 25 year old female, G0P0 is a history of chlamydia who presents with 4 days of vaginal itching and of vaginal irritation that started yesterday. Symptoms started after having vaginal intercourse on 7/16 (pt noted some pain and spotting with intercourse). Last menstrual cycle started on 7/10 and lasted 4 days. Associated symptoms: nausea, cramping, one day of dysuria. Denies fever, blood in urine. Patient states that today's symptoms feel similar to when she was diagnosed with chlamydia last year.  PMH:  Past Medical History  Diagnosis Date  . Chlamydia contact, treated   . PID (acute pelvic inflammatory disease)    OB History    Gravida Para Term Preterm AB TAB SAB Ectopic Multiple Living       Past Surgical History  Procedure Laterality Date  . No past surgeries     No current facility-administered medications on file prior to encounter.   Current Outpatient Prescriptions on File Prior to Encounter  Medication Sig Dispense Refill  . acetaminophen (TYLENOL) 500 MG tablet Take 500 mg by mouth every 6 (six) hours as needed for mild pain or moderate pain. Reported on 04/16/2015    . Multiple Vitamins-Minerals (MULTIVITAMIN & MINERAL PO) Take 1 tablet by mouth daily. Reported on 04/16/2015     No Known Allergies   Review of Systems  Constitutional: Negative for fever.  Gastrointestinal: Positive for nausea and abdominal pain. Negative for vomiting.  Genitourinary: Positive for dysuria. Negative for hematuria and flank pain.  Musculoskeletal: Negative for joint pain.  Skin: Negative for rash.    Objective-   Physical Exam  Constitutional: She appears well-developed.  Abdominal: Soft. Bowel sounds are normal. There is tenderness.  Genitourinary: Vagina normal and uterus normal. There is no rash on the right labia. There is no rash on the left labia. Cervix exhibits discharge. Right adnexum displays no  tenderness. Left adnexum displays no tenderness.  Skin: Skin is warm and dry.  Psychiatric: She has a normal mood and affect.   CBC    Component Value Date/Time   WBC 4.6 08/20/2015 1119   RBC 4.38 08/20/2015 1119   HGB 13.3 08/20/2015 1119   HCT 38.6 08/20/2015 1119   PLT 373 08/20/2015 1119   MCV 88.1 08/20/2015 1119   MCH 30.4 08/20/2015 1119   MCHC 34.5 08/20/2015 1119   RDW 13.0 08/20/2015 1119   Results for orders placed or performed during the hospital encounter of 08/20/15  Wet prep, genital     Status: Abnormal   Collection Time: 08/20/15 11:10 AM  Result Value Ref Range Status   Yeast Wet Prep HPF POC NONE SEEN NONE SEEN Final   Trich, Wet Prep NONE SEEN NONE SEEN Final   Clue Cells Wet Prep HPF POC NONE SEEN NONE SEEN Final   WBC, Wet Prep HPF POC FEW (A) NONE SEEN Final    Comment: MODERATE BACTERIA SEEN   Sperm NONE SEEN  Final   Pregnancy test - negative  STI Screenings: Gonorrhea/Chlamdia test - pending; HIV blood test - pending; RPR - pending  MDM: Given physical exam findings, no obvious sources of vaginal/cervical infection. No PID symptoms. However, cannot rule out any STDs until testing results are processed.  Assessment:  1. Screening for STD (sexually transmitted disease)   2. Vulvovaginitis    Plan:  Discussed with patient the timing of when STI results will be in and  subsequent treatment plans given any positive findings.  Advised patient to treat vulvovaginal irritation with triamcinolone cream and suggested adding OTC vaginal itch cream to regime.  Follow-up Information    [facility]   Specialty:  Obstetrics and Gynecology   Why:  For routine care or as needed   Contact information:   [address] [place] [id number] [phone]    Return to MAU or another ED for severe symptoms such as fever with severe abdominal pain, vomiting, or syncope, etc.     Medication List     STOP taking these medications        diclofenac sodium 1 % Gel  Commonly known as:  VOLTAREN     doxycycline 100 MG capsule  Commonly known as:  VIBRAMYCIN     ibuprofen 800 MG tablet  Commonly known as:  ADVIL,MOTRIN     methocarbamol 500 MG tablet  Commonly known as:  ROBAXIN     metroNIDAZOLE 500 MG tablet  Commonly known as:  FLAGYL      TAKE these medications        acetaminophen 500 MG tablet  Commonly known as:  TYLENOL  Take 500 mg by mouth every 6 (six) hours as needed for mild pain or moderate pain. Reported on 04/16/2015     MULTIVITAMIN & MINERAL PO  Take 1 tablet by mouth daily. Reported on 04/16/2015     nystatin-triamcinolone cream  Commonly known as:  MYCOLOG II  Apply to affected area up to twice daily

## 2015-08-21 LAB — GC/CHLAMYDIA PROBE AMP (~~LOC~~) NOT AT ARMC
Chlamydia: NEGATIVE
Neisseria Gonorrhea: NEGATIVE

## 2015-08-21 LAB — RPR: RPR Ser Ql: NONREACTIVE

## 2015-08-21 LAB — HIV ANTIBODY (ROUTINE TESTING W REFLEX): HIV SCREEN 4TH GENERATION: NONREACTIVE

## 2015-09-19 ENCOUNTER — Encounter: Payer: Self-pay | Admitting: Gynecology

## 2015-09-19 ENCOUNTER — Ambulatory Visit (INDEPENDENT_AMBULATORY_CARE_PROVIDER_SITE_OTHER): Payer: BLUE CROSS/BLUE SHIELD | Admitting: Gynecology

## 2015-09-19 DIAGNOSIS — Z3009 Encounter for other general counseling and advice on contraception: Secondary | ICD-10-CM | POA: Diagnosis not present

## 2015-09-19 DIAGNOSIS — Z01419 Encounter for gynecological examination (general) (routine) without abnormal findings: Secondary | ICD-10-CM | POA: Diagnosis not present

## 2015-09-19 DIAGNOSIS — Z113 Encounter for screening for infections with a predominantly sexual mode of transmission: Secondary | ICD-10-CM

## 2015-09-19 NOTE — Progress Notes (Signed)
Hosie Poissonshley D Newmark 09/07/1990 [id number]   History:    25 y.o. who presented to the office today as a new patient. Patient has not had a Pap smear in the past. Patient was seen in the emergency room early this month and was treated for suspected PID picture. She had a normal pelvic ultrasound. She was discharged home with Vibramycin 100 mg twice a day for 2 weeks. She had been using condoms for contraception she reports that on July 16 she had intercourse without any condoms and felt irritated 2 days later he got worse and on the 19th is when she had gone to the emergency room and then again in August. She had a negative HIV and RPR in July of this year as well as a negative GC and Chlamydia culture. She is here also for a test of cure? Patient use oral contraceptive pills in the past but had initially with compliance. She has been treated for Chlamydia in 2015 as well as a patient smokes marijuana recreational and alcohol socially.  Patient uncertain if she has ever received the HPV vaccine series  Past medical history,surgical history, family history and social history were all reviewed and documented in the EPIC chart.  Gynecologic History Patient's last menstrual period was 09/08/2015. Contraception: none Last Pap: No previous study. Results were: No previous study Last mammogram: Not indicated. Results were: Not indicated  Obstetric History OB History  Gravida Para Term Preterm AB Living  0 0 0 0 0 0  SAB TAB Ectopic Multiple Live Births  0 0 0 0           ROS: A ROS was performed and pertinent positives and negatives are included in the history.  GENERAL: No fevers or chills. HEENT: No change in vision, no earache, sore throat or sinus congestion. NECK: No pain or stiffness. CARDIOVASCULAR: No chest pain or pressure. No palpitations. PULMONARY: No shortness of breath, cough or wheeze. GASTROINTESTINAL: No abdominal pain, nausea, vomiting or diarrhea, melena or bright red blood per  rectum. GENITOURINARY: No urinary frequency, urgency, hesitancy or dysuria. MUSCULOSKELETAL: No joint or muscle pain, no back pain, no recent trauma. DERMATOLOGIC: No rash, no itching, no lesions. ENDOCRINE: No polyuria, polydipsia, no heat or cold intolerance. No recent change in weight. HEMATOLOGICAL: No anemia or easy bruising or bleeding. NEUROLOGIC: No headache, seizures, numbness, tingling or weakness. PSYCHIATRIC: No depression, no loss of interest in normal activity or change in sleep pattern.     Exam: chaperone present  BP 126/80   Ht 5\' 4"  (1.626 m)   Wt 167 lb (75.8 kg)   LMP 09/08/2015   BMI 28.67 kg/m   Body mass index is 28.67 kg/m.  General appearance : Well developed well nourished female. No acute distress HEENT: Eyes: no retinal hemorrhage or exudates,  Neck supple, trachea midline, no carotid bruits, no thyroidmegaly Lungs: Clear to auscultation, no rhonchi or wheezes, or rib retractions  Heart: Regular rate and rhythm, no murmurs or gallops Breast:Examined in sitting and supine position were symmetrical in appearance, no palpable masses or tenderness,  no skin retraction, no nipple inversion, no nipple discharge, no skin discoloration, no axillary or supraclavicular lymphadenopathy Abdomen: no palpable masses or tenderness, no rebound or guarding Extremities: no edema or skin discoloration or tenderness  Pelvic:  Bartholin, Urethra, Skene Glands: Within normal limits             Vagina: No gross lesions or discharge  Cervix: No gross lesions or discharge  Uterus  anteverted, normal size, shape and consistency, non-tender and mobile  Adnexa  Without masses or tenderness  Anus and perineum  normal   Rectovaginal  normal sphincter tone without palpated masses or tenderness             Hemoccult not indicated   GC committed culture obtained  Assessment/Plan:  25 y.o. female for annual exam will [facility] to see if she has ever received  the HPV vaccine series. I've given her literature information on the HPV vaccine. We also discussed other forms of contraception not give her literature on the Nexplanon as well as on theSkyla IUD. A GC and Chlamydia culture was obtained today. Pap smear without HPV was done today as well. I have recommended that patient have a follow-up HIV test 6 months from her last study in July of this year.    Ok EdwardsFERNANDEZ,Kristia Jupiter H MD, 9:53 AM 09/19/2015

## 2015-09-19 NOTE — Patient Instructions (Signed)
HPV (Human Papillomavirus) Vaccine--Gardasil-9:  1. Why get vaccinated? Gardasil-9 prevents human papillomavirus (HPV) types that cause many cancers, including:  cervical cancer in females,  vaginal and vulvar cancers in females,  anal cancer in females and males,  throat cancer in females and males, and  penile cancer in males. In addition, Gardasil-9 prevents HPV types that cause genital warts in both females and males. In the [place] about 12,000 women get cervical cancer every year, and about 4,000 women die from it. Gardasil-9 can prevent most of these cases of cervical cancer. Vaccination is not a substitute for cervical cancer screening. This vaccine does not protect against all HPV types that can cause cervical cancer. Women should still get regular Pap tests. HPV infection usually comes from sexual contact, and most people will become infected at some point in their life. About 14 million Americans, including teens, get infected every year. Most infections will go away and not cause serious problems. But thousands of women and men get cancer and diseases from HPV. 2. HPV vaccine Gardasil-9 is an FDA-approved HPV vaccine. It is recommended for both males and females. It is routinely given at 11 or 23 years of age, but it may be given beginning at age 9 years through age 26 years. Three doses of Gardasil-9 are recommended with the second dose given 1-2 months after the first dose and the third dose given 6 months after the first dose. 3. Some people should not get this vaccine  Anyone who has had a severe, life-threatening allergic reaction to a dose of HPV vaccine should not get another dose.  Anyone who has a severe (life threatening) allergy to any component of HPV vaccine should not get the vaccine. Tell your doctor if you have any severe allergies that you know of, including a severe allergy to yeast.  HPV vaccine is not recommended for pregnant women. If you learn that you were  pregnant when you were vaccinated, there is no reason to expect any problems for you or your baby. Any woman who learns she was pregnant when she got Gardasil-9 vaccine is encouraged to contact the manufacturer's registry for HPV vaccination during pregnancy at 1-800-986-8999. Women who are breastfeeding may be vaccinated.  If you have a mild illness, such as a cold, you can probably get the vaccine today. If you are moderately or severely ill, you should probably wait until you recover. Your doctor can advise you. 4. Risks of a vaccine reaction With any medicine, including vaccines, there is a chance of side effects. These are usually mild and go away on their own, but serious reactions are also possible. Most people who get HPV vaccine do not have any serious problems with it. Mild or moderate problems following Gardasil-9:  Reactions in the arm where the shot was given:  Soreness (about 9 people in 10)  Redness or swelling (about 1 person in 3)  Fever:  Mild (100F) (about 1 person in 10)  Moderate (102F) (about 1 person in 65)  Other problems:  Headache (about 1 person in 3) Problems that could happen after any injected vaccine:  People sometimes faint after a medical procedure, including vaccination. Sitting or lying down for about 15 minutes can help prevent fainting, and injuries caused by a fall. Tell your doctor if you feel dizzy, or have vision changes or ringing in the ears.  Some people get severe pain in the shoulder and have difficulty moving the arm where a shot was given. This happens   very rarely.  Any medication can cause a severe allergic reaction. Such reactions from a vaccine are very rare, estimated at about 1 in a million doses, and would happen within a few minutes to a few hours after the vaccination. As with any medicine, there is a very remote chance of a vaccine causing a serious injury or death. The safety of vaccines is always being monitored. For more  information, visit: http://floyd.org/. 5. What if there is a serious reaction? What should I look for? Look for anything that concerns you, such as signs of a severe allergic reaction, very high fever, or unusual behavior. Signs of a severe allergic reaction can include hives, swelling of the face and throat, difficulty breathing, a fast heartbeat, dizziness, and weakness. These would usually start a few minutes to a few hours after the vaccination. What should I do? If you think it is a severe allergic reaction or other emergency that can't wait, call 9-1-1 or get to the nearest hospital. Otherwise, call your doctor. Afterward, the reaction should be reported to the "Vaccine Adverse Event Reporting System" (VAERS). Your doctor might file this report, or you can do it yourself through the VAERS web site at www.vaers.LAgents.no, or by calling 1-279 619 0368. VAERS does not give medical advice. 6. The National Vaccine Injury Compensation Program The [employer] Vaccine Injury Compensation Program (VICP) is a federal program that was created to compensate people who may have been injured by certain vaccines. Persons who believe they may have been injured by a vaccine can learn about the program and about filing a claim by calling 1-(684) 545-0085 or visiting the VICP website at SpiritualWord.at. There is a time limit to file a claim for compensation. 7. How can I learn more?  Ask your health care provider. He or she can give you the vaccine package insert or suggest other sources of information.  Call your local or state health department.  Contact the Centers for Disease Control and Prevention (CDC):  Call 8186520887 (1-800-CDC-INFO) or  Visit CDC's website at RunningConvention.de Vaccine Information Statement HPV Vaccine Lorraine Williamson) 05/02/14   This information is not intended to replace advice given to you by your health care provider. Make sure you discuss any questions you  have with your health care provider.   Document Released: 08/15/2013 Document Revised: 06/04/2014 Document Reviewed: 08/15/2013 Elsevier Interactive Patient Education 2016 [employer]. Levonorgestrel intrauterine device (IUD) What is this medicine? LEVONORGESTREL IUD (LEE voe nor jes trel) is a contraceptive (birth control) device. The device is placed inside the uterus by a healthcare professional. It is used to prevent pregnancy and can also be used to treat heavy bleeding that occurs during your period. Depending on the device, it can be used for 3 to 5 years. This medicine may be used for other purposes; ask your health care provider or pharmacist if you have questions. What should I tell my health care provider before I take this medicine? They need to know if you have any of these conditions: -abnormal Pap smear -cancer of the breast, uterus, or cervix -diabetes -endometritis -genital or pelvic infection now or in the past -have more than one sexual partner or your partner has more than one partner -heart disease -history of an ectopic or tubal pregnancy -immune system problems -IUD in place -liver disease or tumor -problems with blood clots or take blood-thinners -use intravenous drugs -uterus of unusual shape -vaginal bleeding that has not been explained -an unusual or allergic reaction to levonorgestrel, other hormones, silicone, or  polyethylene, medicines, foods, dyes, or preservatives -pregnant or trying to get pregnant -breast-feeding How should I use this medicine? This device is placed inside the uterus by a health care professional. Talk to your pediatrician regarding the use of this medicine in children. Special care may be needed. Overdosage: If you think you have taken too much of this medicine contact a poison control center or emergency room at once. NOTE: This medicine is only for you. Do not share this medicine with others. What if I miss a dose? This does  not apply. What may interact with this medicine? Do not take this medicine with any of the following medications: -amprenavir -bosentan -fosamprenavir This medicine may also interact with the following medications: -aprepitant -barbiturate medicines for inducing sleep or treating seizures -bexarotene -griseofulvin -medicines to treat seizures like carbamazepine, ethotoin, felbamate, oxcarbazepine, phenytoin, topiramate -modafinil -pioglitazone -rifabutin -rifampin -rifapentine -some medicines to treat HIV infection like atazanavir, indinavir, lopinavir, nelfinavir, tipranavir, ritonavir -St. John's wort -warfarin This list may not describe all possible interactions. Give your health care provider a list of all the medicines, herbs, non-prescription drugs, or dietary supplements you use. Also tell them if you smoke, drink alcohol, or use illegal drugs. Some items may interact with your medicine. What should I watch for while using this medicine? Visit your doctor or health care professional for regular check ups. See your doctor if you or your partner has sexual contact with others, becomes HIV positive, or gets a sexual transmitted disease. This product does not protect you against HIV infection (AIDS) or other sexually transmitted diseases. You can check the placement of the IUD yourself by reaching up to the top of your vagina with clean fingers to feel the threads. Do not pull on the threads. It is a good habit to check placement after each menstrual period. Call your doctor right away if you feel more of the IUD than just the threads or if you cannot feel the threads at all. The IUD may come out by itself. You may become pregnant if the device comes out. If you notice that the IUD has come out use a backup birth control method like condoms and call your health care provider. Using tampons will not change the position of the IUD and are okay to use during your period. What side effects  may I notice from receiving this medicine? Side effects that you should report to your doctor or health care professional as soon as possible: -allergic reactions like skin rash, itching or hives, swelling of the face, lips, or tongue -fever, flu-like symptoms -genital sores -high blood pressure -no menstrual period for 6 weeks during use -pain, swelling, warmth in the leg -pelvic pain or tenderness -severe or sudden headache -signs of pregnancy -stomach cramping -sudden shortness of breath -trouble with balance, talking, or walking -unusual vaginal bleeding, discharge -yellowing of the eyes or skin Side effects that usually do not require medical attention (report to your doctor or health care professional if they continue or are bothersome): -acne -breast pain -change in sex drive or performance -changes in weight -cramping, dizziness, or faintness while the device is being inserted -headache -irregular menstrual bleeding within first 3 to 6 months of use -nausea This list may not describe all possible side effects. Call your doctor for medical advice about side effects. You may report side effects to FDA at 1-800-FDA-1088. Where should I keep my medicine? This does not apply. NOTE: This sheet is a summary. It may not cover all  possible information. If you have questions about this medicine, talk to your doctor, pharmacist, or health care provider.    2016, Elsevier/Gold Standard. (2011-02-18 13:54:04) Levonorgestrel intrauterine device (IUD) What is this medicine? LEVONORGESTREL IUD (LEE voe nor jes trel) is a contraceptive (birth control) device. The device is placed inside the uterus by a healthcare professional. It is used to prevent pregnancy and can also be used to treat heavy bleeding that occurs during your period. Depending on the device, it can be used for 3 to 5 years. This medicine may be used for other purposes; ask your health care provider or pharmacist if you have  questions. What should I tell my health care provider before I take this medicine? They need to know if you have any of these conditions: -abnormal Pap smear -cancer of the breast, uterus, or cervix -diabetes -endometritis -genital or pelvic infection now or in the past -have more than one sexual partner or your partner has more than one partner -heart disease -history of an ectopic or tubal pregnancy -immune system problems -IUD in place -liver disease or tumor -problems with blood clots or take blood-thinners -use intravenous drugs -uterus of unusual shape -vaginal bleeding that has not been explained -an unusual or allergic reaction to levonorgestrel, other hormones, silicone, or polyethylene, medicines, foods, dyes, or preservatives -pregnant or trying to get pregnant -breast-feeding How should I use this medicine? This device is placed inside the uterus by a health care professional. Talk to your pediatrician regarding the use of this medicine in children. Special care may be needed. Overdosage: If you think you have taken too much of this medicine contact a poison control center or emergency room at once. NOTE: This medicine is only for you. Do not share this medicine with others. What if I miss a dose? This does not apply. What may interact with this medicine? Do not take this medicine with any of the following medications: -amprenavir -bosentan -fosamprenavir This medicine may also interact with the following medications: -aprepitant -barbiturate medicines for inducing sleep or treating seizures -bexarotene -griseofulvin -medicines to treat seizures like carbamazepine, ethotoin, felbamate, oxcarbazepine, phenytoin, topiramate -modafinil -pioglitazone -rifabutin -rifampin -rifapentine -some medicines to treat HIV infection like atazanavir, indinavir, lopinavir, nelfinavir, tipranavir, ritonavir -St. John's wort -warfarin This list may not describe all possible  interactions. Give your health care provider a list of all the medicines, herbs, non-prescription drugs, or dietary supplements you use. Also tell them if you smoke, drink alcohol, or use illegal drugs. Some items may interact with your medicine. What should I watch for while using this medicine? Visit your doctor or health care professional for regular check ups. See your doctor if you or your partner has sexual contact with others, becomes HIV positive, or gets a sexual transmitted disease. This product does not protect you against HIV infection (AIDS) or other sexually transmitted diseases. You can check the placement of the IUD yourself by reaching up to the top of your vagina with clean fingers to feel the threads. Do not pull on the threads. It is a good habit to check placement after each menstrual period. Call your doctor right away if you feel more of the IUD than just the threads or if you cannot feel the threads at all. The IUD may come out by itself. You may become pregnant if the device comes out. If you notice that the IUD has come out use a backup birth control method like condoms and call your health care provider. Using  tampons will not change the position of the IUD and are okay to use during your period. What side effects may I notice from receiving this medicine? Side effects that you should report to your doctor or health care professional as soon as possible: -allergic reactions like skin rash, itching or hives, swelling of the face, lips, or tongue -fever, flu-like symptoms -genital sores -high blood pressure -no menstrual period for 6 weeks during use -pain, swelling, warmth in the leg -pelvic pain or tenderness -severe or sudden headache -signs of pregnancy -stomach cramping -sudden shortness of breath -trouble with balance, talking, or walking -unusual vaginal bleeding, discharge -yellowing of the eyes or skin Side effects that usually do not require medical attention  (report to your doctor or health care professional if they continue or are bothersome): -acne -breast pain -change in sex drive or performance -changes in weight -cramping, dizziness, or faintness while the device is being inserted -headache -irregular menstrual bleeding within first 3 to 6 months of use -nausea This list may not describe all possible side effects. Call your doctor for medical advice about side effects. You may report side effects to FDA at 1-800-FDA-1088. Where should I keep my medicine? This does not apply. NOTE: This sheet is a summary. It may not cover all possible information. If you have questions about this medicine, talk to your doctor, pharmacist, or health care provider.    2016, Elsevier/Gold Standard. (2011-02-18 13:54:04)

## 2015-09-19 NOTE — Addendum Note (Signed)
Addended by: Kem ParkinsonBARNES, Zeba Luby on: 09/19/2015 12:09 PM   Modules accepted: Orders

## 2015-09-20 LAB — GC/CHLAMYDIA PROBE AMP
CT Probe RNA: NOT DETECTED
GC Probe RNA: NOT DETECTED

## 2015-09-22 LAB — PAP, TP IMAGING W/ HPV RNA, RFLX HPV TYPE 16,18/45: HPV mRNA, High Risk: DETECTED — AB

## 2015-09-30 LAB — HPV TYPE 16 AND 18/45 RNA
HPV TYPE 16 RNA: NOT DETECTED
HPV Type 18/45 RNA: NOT DETECTED

## 2015-10-30 ENCOUNTER — Ambulatory Visit (INDEPENDENT_AMBULATORY_CARE_PROVIDER_SITE_OTHER): Payer: BLUE CROSS/BLUE SHIELD | Admitting: Gynecology

## 2015-10-30 ENCOUNTER — Encounter: Payer: Self-pay | Admitting: Gynecology

## 2015-10-30 VITALS — BP 124/80 | Ht 64.0 in | Wt 167.0 lb

## 2015-10-30 DIAGNOSIS — Z23 Encounter for immunization: Secondary | ICD-10-CM

## 2015-10-30 DIAGNOSIS — N871 Moderate cervical dysplasia: Secondary | ICD-10-CM | POA: Diagnosis not present

## 2015-10-30 NOTE — Progress Notes (Addendum)
   Patient is a 25 year old that presented to the office today to discuss her recent abnormal Pap smear and for colposcopic evaluation. Patient had never had a Pap smear in the past. 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 Patient's currently using condoms for contraception. Had a full STD screen last visit which was negative. Patient has had history in the past of chlamydia infection. Patient has not had the HPV vaccine.  Patient was counseled for colposcopy. She underwent a detail colposcopic evaluation of the external genitalia, perineum, perirectal region no lesions were seen. The speculum was introduced into the vagina. A systematic inspection of the vagina and vaginal fornix demonstrated no lesion at the ectocervix that was leukoplakic area noted at 6:00 position. The endocervical speculum was utilized transformation zone was visualized entirely. A cervical biopsy from the 6:00 position along with an ECC was obtained. Silver nitrate and Monsel solution was used for hemostasis.  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 Underwent detail colposcopic evaluation with the above-mentioned findings. 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 Nuchal guidelines discussed. Contraceptive options were discussed. Literature information on the Nexplanon provided. Patient interested in proceeding with the HPV vaccine series. Her mother was present. Literature information was provided. Patient fully aware that she needs to use condoms during the time of intercourse and cannot get  pregnant at the time of the vaccination timeframe. Patient declined flu vaccine.

## 2015-10-30 NOTE — Addendum Note (Signed)
Addended by: Kem ParkinsonBARNES, Asjah Rauda on: 10/30/2015 01:03 PM   Modules accepted: Orders

## 2015-10-30 NOTE — Addendum Note (Signed)
Addended by: Kem ParkinsonBARNES, Criston Chancellor on: 10/30/2015 04:38 PM   Modules accepted: Orders

## 2015-10-30 NOTE — Patient Instructions (Addendum)
Etonogestrel implant What is this medicine? ETONOGESTREL (et oh noe JES trel) is a contraceptive (birth control) device. It is used to prevent pregnancy. It can be used for up to 3 years. This medicine may be used for other purposes; ask your health care provider or pharmacist if you have questions. What should I tell my health care provider before I take this medicine? They need to know if you have any of these conditions: -abnormal vaginal bleeding -blood vessel disease or blood clots -cancer of the breast, cervix, or liver -depression -diabetes -gallbladder disease -headaches -heart disease or recent heart attack -high blood pressure -high cholesterol -kidney disease -liver disease -renal disease -seizures -tobacco smoker -an unusual or allergic reaction to etonogestrel, other hormones, anesthetics or antiseptics, medicines, foods, dyes, or preservatives -pregnant or trying to get pregnant -breast-feeding How should I use this medicine? This device is inserted just under the skin on the inner side of your upper arm by a health care professional. Talk to your pediatrician regarding the use of this medicine in children. Special care may be needed. Overdosage: If you think you have taken too much of this medicine contact a poison control center or emergency room at once. NOTE: This medicine is only for you. Do not share this medicine with others. What if I miss a dose? This does not apply. What may interact with this medicine? Do not take this medicine with any of the following medications: -amprenavir -bosentan -fosamprenavir This medicine may also interact with the following medications: -barbiturate medicines for inducing sleep or treating seizures -certain medicines for fungal infections like ketoconazole and itraconazole -griseofulvin -medicines to treat seizures like carbamazepine, felbamate, oxcarbazepine, phenytoin,  topiramate -modafinil -phenylbutazone -rifampin -some medicines to treat HIV infection like atazanavir, indinavir, lopinavir, nelfinavir, tipranavir, ritonavir -St. John's wort This list may not describe all possible interactions. Give your health care provider a list of all the medicines, herbs, non-prescription drugs, or dietary supplements you use. Also tell them if you smoke, drink alcohol, or use illegal drugs. Some items may interact with your medicine. What should I watch for while using this medicine? This product does not protect you against HIV infection (AIDS) or other sexually transmitted diseases. You should be able to feel the implant by pressing your fingertips over the skin where it was inserted. Contact your doctor if you cannot feel the implant, and use a non-hormonal birth control method (such as condoms) until your doctor confirms that the implant is in place. If you feel that the implant may have broken or become bent while in your arm, contact your healthcare provider. What side effects may I notice from receiving this medicine? Side effects that you should report to your doctor or health care professional as soon as possible: -allergic reactions like skin rash, itching or hives, swelling of the face, lips, or tongue -breast lumps -changes in emotions or moods -depressed mood -heavy or prolonged menstrual bleeding -pain, irritation, swelling, or bruising at the insertion site -scar at site of insertion -signs of infection at the insertion site such as fever, and skin redness, pain or discharge -signs of pregnancy -signs and symptoms of a blood clot such as breathing problems; changes in vision; chest pain; severe, sudden headache; pain, swelling, warmth in the leg; trouble speaking; sudden numbness or weakness of the face, arm or leg -signs and symptoms of liver injury like dark yellow or brown urine; general ill feeling or flu-like symptoms; light-colored stools; loss of  appetite; nausea; right upper belly   pain; unusually weak or tired; yellowing of the eyes or skin -unusual vaginal bleeding, discharge -signs and symptoms of a stroke like changes in vision; confusion; trouble speaking or understanding; severe headaches; sudden numbness or weakness of the face, arm or leg; trouble walking; dizziness; loss of balance or coordination Side effects that usually do not require medical attention (Report these to your doctor or health care professional if they continue or are bothersome.): -acne -back pain -breast pain -changes in weight -dizziness -general ill feeling or flu-like symptoms -headache -irregular menstrual bleeding -nausea -sore throat -vaginal irritation or inflammation This list may not describe all possible side effects. Call your doctor for medical advice about side effects. You may report side effects to FDA at 1-800-FDA-1088. Where should I keep my medicine? This drug is given in a hospital or clinic and will not be stored at home. NOTE: This sheet is a summary. It may not cover all possible information. If you have questions about this medicine, talk to your doctor, pharmacist, or health care provider.    2016, Elsevier/Gold Standard. (2013-11-02 14:07:06) Colposcopy, Care After Refer to this sheet in the next few weeks. These instructions provide you with information on caring for yourself after your procedure. Your health care provider may also give you more specific instructions. Your treatment has been planned according to current medical practices, but problems sometimes occur. Call your health care provider if you have any problems or questions after your procedure. WHAT TO EXPECT AFTER THE PROCEDURE  After your procedure, it is typical to have the following:  Cramping. This often goes away in a few minutes.  Soreness. This may last for 2 days.  Lightheadedness. Lie down for a few minutes if this occurs. You may also have some  bleeding or dark discharge for a few days. You may need to wear a sanitary pad during this time. HOME CARE INSTRUCTIONS  Avoid sex, douching, and using tampons for 3 days or as directed by your health care provider.  Only take over-the-counter or prescription medicines as directed by your health care provider. Do not take aspirin because it can cause bleeding.  Continue to take birth control pills if you are on them.  Not all test results are available during your visit. If your test results are not back during the visit, make an appointment with your health care provider to find out the results. Do not assume everything is normal if you have not heard from your health care provider or the medical facility. It is important for you to follow up on all of your test results.  Follow your health care provider's advice regarding activity, follow-up visits, and follow-up Pap tests. SEEK MEDICAL CARE IF:  You develop a rash.  You have problems with your medicine. SEEK IMMEDIATE MEDICAL CARE IF:  You are bleeding heavily or are passing blood clots.  You have a fever.  You have abnormal vaginal discharge.  You are having cramps that do not go away after taking your pain medicine.  You feel lightheaded, dizzy, or faint.  You have stomach pain.   This information is not intended to replace advice given to you by your health care provider. Make sure you discuss any questions you have with your health care provider.   Document Released: 11/08/2012 Document Reviewed: 11/08/2012 Elsevier Interactive Patient Education 2016 Elsevier Inc. Cervical Dysplasia Cervical dysplasia is a condition in which a woman has abnormal changes in the cells of her cervix. The cervix is the opening  to the uterus (womb). It is located between the vagina and the uterus. Cervical dysplasia may be the first sign of cervical cancer.  With early detection, treatment, and close follow-up care, nearly all cases of  cervical dysplasia can be cured. If left untreated, dysplasia may become more severe.  CAUSES  Cervical dysplasia can be caused by a human papillomavirus (HPV) infection. RISK FACTORS   Having had a sexually transmitted disease, such as chlamydia or a human papillomavirus (HPV) infection.   Becoming sexually active before age 24.   Having had more than one sexual partner.   Not using protection during sexual intercourse, especially with new sexual partners.   Having had cancer of the vagina or vulva.   Having a sexual partner whose previous partner had cancer of the cervix or cervical dysplasia.   Having a sexual partner who has or has had cancer of the penis.   Having a weakened immune system (such as from having HIV or an organ transplant).   Being the daughter of a woman who took diethylstilbestrol(DES) during pregnancy.   Having a family history of cervical cancer.   Smoking. SIGNS AND SYMPTOMS  There are usually no symptoms. If there are symptoms, they may include:   Abnormal vaginal discharge.   Bleeding between periods or after intercourse.   Bleeding during menopause.   Pain during sexual intercourse (dyspareunia). DIAGNOSIS  A test called a Pap test may be done.During this test, cells are taken from the cervix and then looked at under a microscope. A test in which tissue is removed from the cervix (biopsy) may also be done if the Pap test is abnormal or if the cervix looks abnormal.  TREATMENT  Treatment varies based on the severity of the cervical dysplasia. Treatment may include:  Cryotherapy. During cryotherapy, the abnormal cells are frozen with a steel-tip instrument.   A procedure to remove abnormal tissue from the cervix.  Surgery to remove abnormal tissue. This is usually done in serious cases of cervical dysplasia. Surgical options include:  A cone biopsy. This is a procedure in which the cervical canal and a portion of the center of  the cervix are removed.   Hysterectomy. This is a surgery in which the uterus and cervix are removed. HOME CARE INSTRUCTIONS   Only take over-the-counter or prescription medicines for pain or discomfort as directed by your health care provider.   Do not use tampons, have sexual intercourse, or douche until your health care provider says it is okay.  Keep follow-up appointments as directed by your health care provider. Women who have been treated for cervical dysplasia should have regular pelvic exams and Pap tests. During the first year following treatment of cervical dysplasia, Pap tests should be done every 3-4 months. In the second year, they should be done every 6 months or as recommended by your health care provider.  To prevent the condition from developing again, practice safe sex. SEEK MEDICAL CARE IF:  You develop genital warts.  SEEK IMMEDIATE MEDICAL CARE IF:   Your menstrual period is heavier than normal.   You develop bright red bleeding, especially if you have blood clots.   You have a fever.   You have increasing cramps or pain not relieved with medicine.   You are light-headed, unusually weak, or have fainting spells.   You have abnormal vaginal discharge.   You have abdominal pain.   This information is not intended to replace advice given to you by your health  care provider. Make sure you discuss any questions you have with your health care provider.   Document Released: 01/18/2005 Document Revised: 01/23/2013 Document Reviewed: 09/13/2012 Elsevier Interactive Patient Education [employer]2016 Elsevier Inc.

## 2015-10-31 ENCOUNTER — Encounter: Payer: Self-pay | Admitting: Gynecology

## 2015-12-30 ENCOUNTER — Ambulatory Visit (INDEPENDENT_AMBULATORY_CARE_PROVIDER_SITE_OTHER): Payer: BLUE CROSS/BLUE SHIELD | Admitting: Anesthesiology

## 2015-12-30 DIAGNOSIS — Z23 Encounter for immunization: Secondary | ICD-10-CM | POA: Diagnosis not present

## 2016-04-06 ENCOUNTER — Ambulatory Visit: Payer: BLUE CROSS/BLUE SHIELD

## 2016-04-14 ENCOUNTER — Ambulatory Visit: Payer: BLUE CROSS/BLUE SHIELD

## 2016-06-16 ENCOUNTER — Encounter: Payer: Self-pay | Admitting: Gynecology

## 2017-01-31 IMAGING — US US PELVIS COMPLETE
1 series · 14 of 25 positions shown · non-contrast
Comparison: None

CLINICAL DATA: Midline pelvic pain, cramping for 5 days.

EXAM:
TRANSABDOMINAL AND TRANSVAGINAL ULTRASOUND OF PELVIS
TECHNIQUE: Both transabdominal and transvaginal ultrasound examinations of the
pelvis were performed. Transabdominal technique was performed for
global imaging of the pelvis including uterus, ovaries, adnexal
regions, and pelvic cul-de-sac. It was necessary to proceed with
endovaginal exam following the transabdominal exam to visualize the
uterus, endometrium, ovaries and adnexa .

[Series 1: us pelvis complete · 0.24mm/px · 14 of 64 slices shown]
[im 1/64]
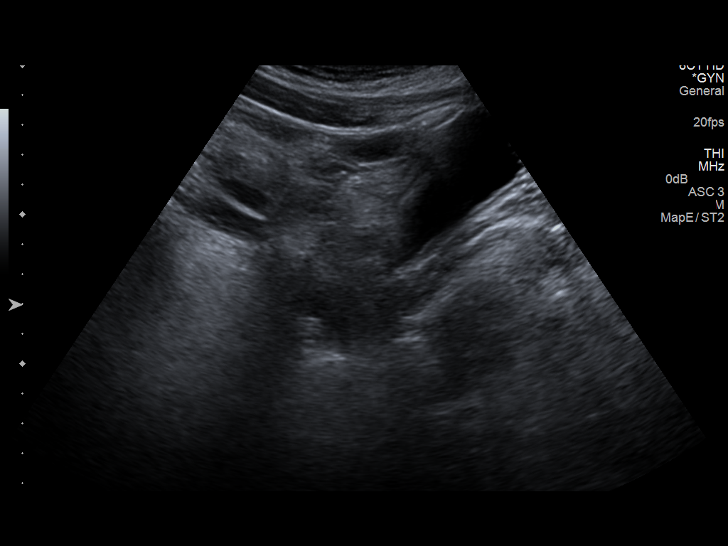
[im 6/64]
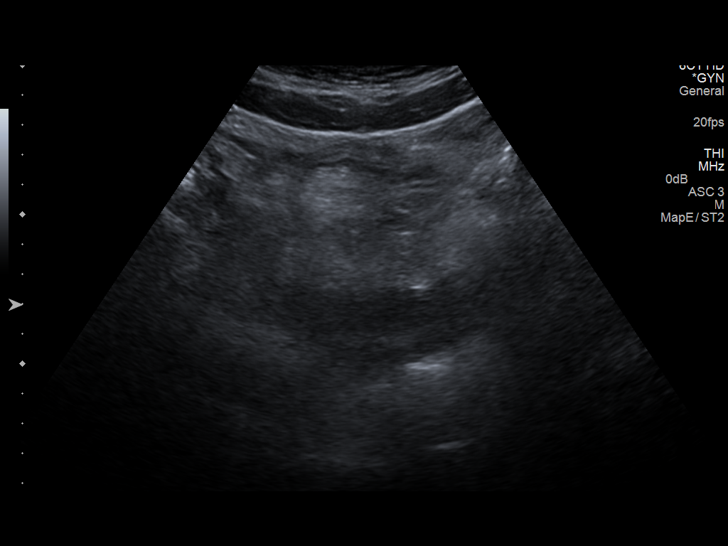
[im 11/64]
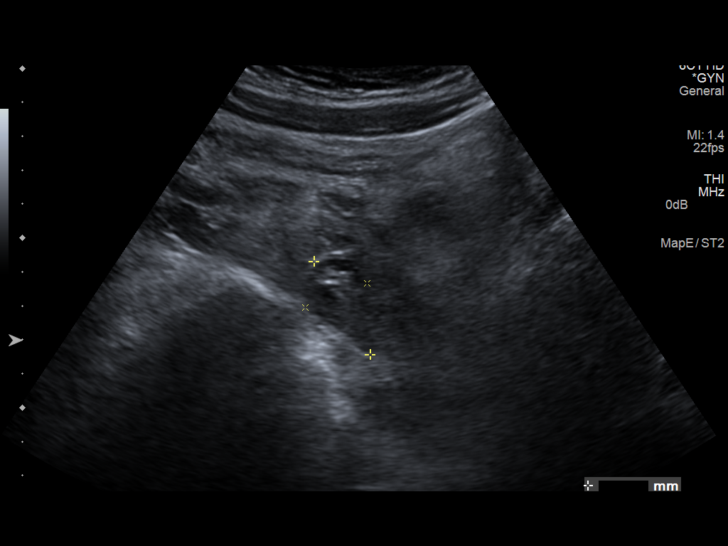
[im 16/64]
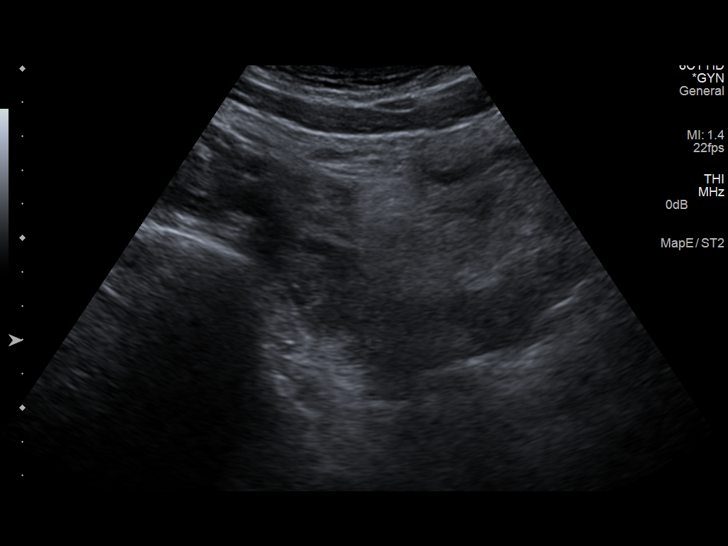
[im 22/64]
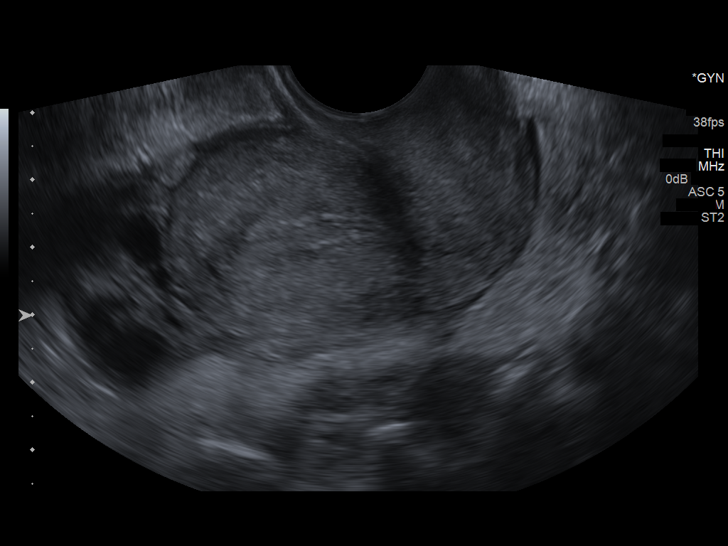
[im 24/64]
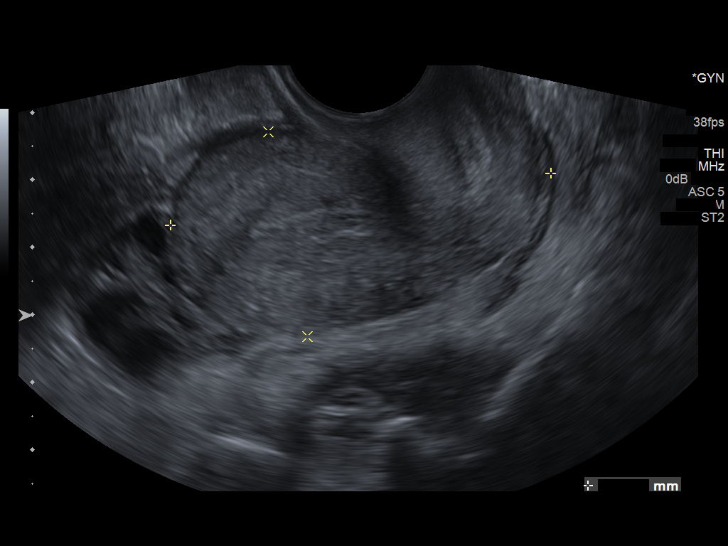
[im 29/64]
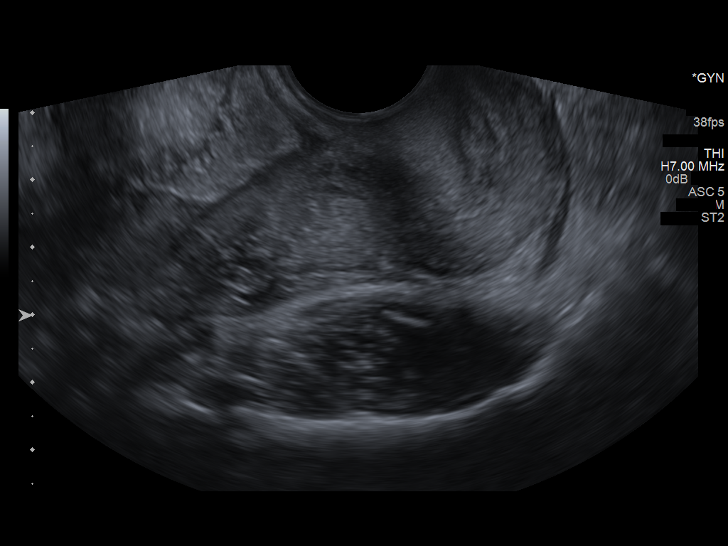
[im 35/64]
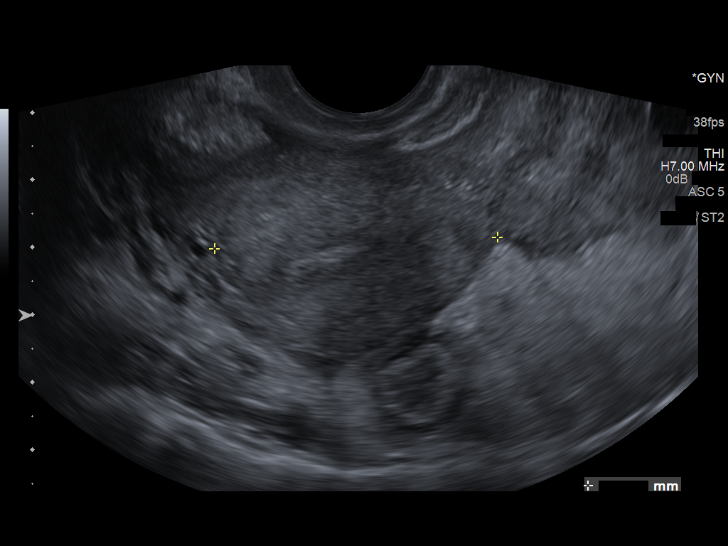
[im 40/64]
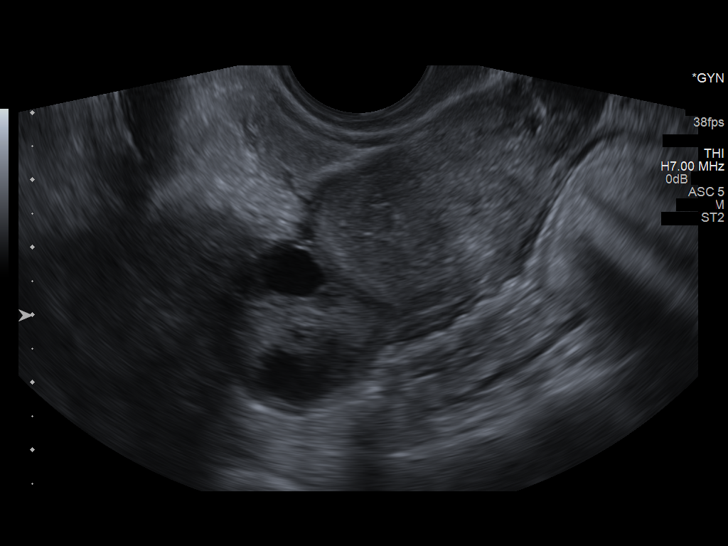
[im 43/64]
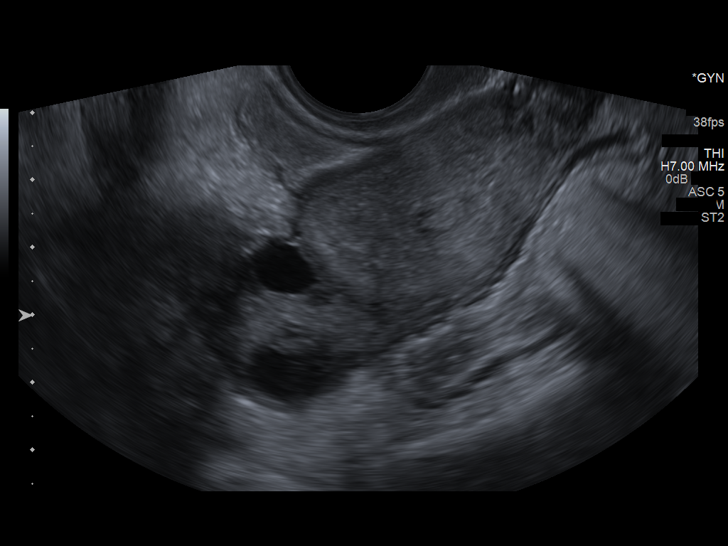
[im 48/64]
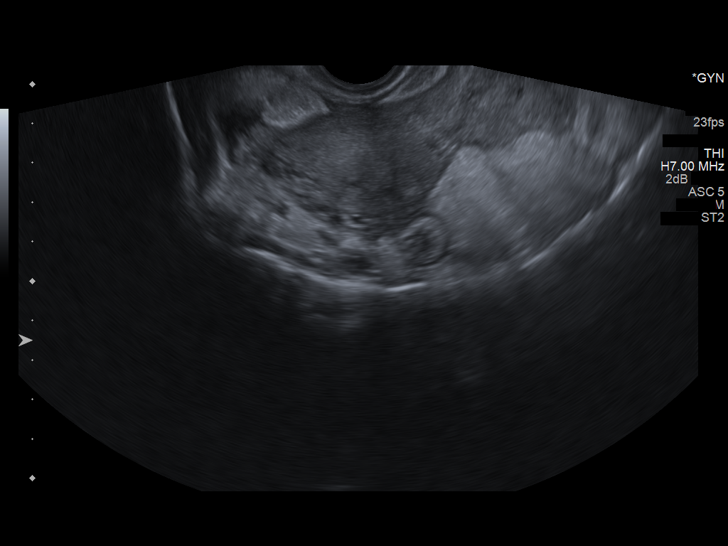
[im 53/64]
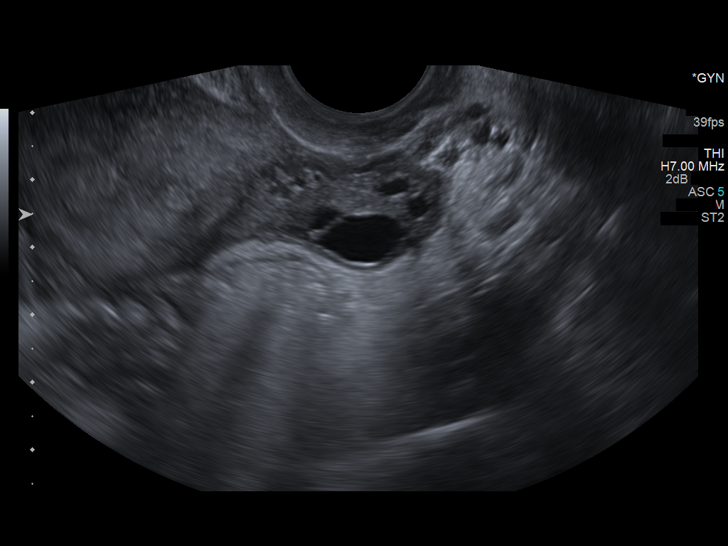
[im 58/64]
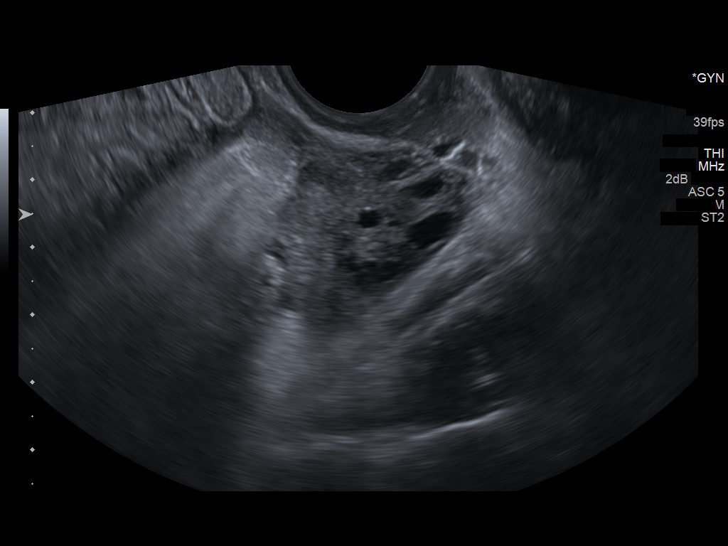
[im 64/64]
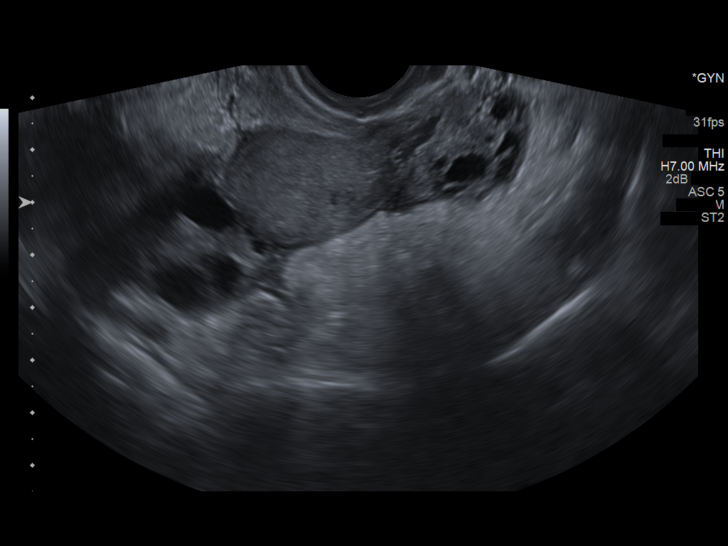

[14 of 25 positions shown; findings below may reference images not displayed]

FINDINGS: Uterus

Measurements: 5.7 x 3.1 x 4.2 cm. No fibroids or other mass
visualized.

Endometrium

Thickness: 4 mm.  No focal abnormality visualized.

Right ovary

Measurements: 2.7 x 2.8 x 3.3 cm. Normal appearance/no adnexal mass.
Small follicles.

Left ovary

Measurements: 2.9 x 1.8 x 1.7 cm. Normal appearance/no adnexal mass.
Small follicles.

Other findings

No free fluid.
IMPRESSION: Unremarkable pelvic ultrasound.

## 2018-12-11 ENCOUNTER — Other Ambulatory Visit: Payer: Self-pay

## 2018-12-11 DIAGNOSIS — Z20822 Contact with and (suspected) exposure to covid-19: Secondary | ICD-10-CM

## 2018-12-14 LAB — NOVEL CORONAVIRUS, NAA: SARS-CoV-2, NAA: NOT DETECTED

## 2020-03-24 ENCOUNTER — Emergency Department (HOSPITAL_BASED_OUTPATIENT_CLINIC_OR_DEPARTMENT_OTHER)
Admission: EM | Admit: 2020-03-24 | Discharge: 2020-03-24 | Disposition: A | Discharge status: Home / Self Care | Payer: No Typology Code available for payment source | Attending: Emergency Medicine | Admitting: Emergency Medicine

## 2020-03-24 ENCOUNTER — Other Ambulatory Visit: Payer: Self-pay

## 2020-03-24 DIAGNOSIS — Z87891 Personal history of nicotine dependence: Secondary | ICD-10-CM | POA: Diagnosis not present

## 2020-03-24 DIAGNOSIS — Y92410 Unspecified street and highway as the place of occurrence of the external cause: Secondary | ICD-10-CM | POA: Diagnosis not present

## 2020-03-24 DIAGNOSIS — R1033 Periumbilical pain: Secondary | ICD-10-CM | POA: Diagnosis not present

## 2020-03-24 DIAGNOSIS — M545 Low back pain, unspecified: Secondary | ICD-10-CM | POA: Diagnosis not present

## 2020-03-24 LAB — URINALYSIS, ROUTINE W REFLEX MICROSCOPIC
Bilirubin Urine: NEGATIVE
Glucose, UA: NEGATIVE mg/dL
Ketones, ur: NEGATIVE mg/dL
Leukocytes,Ua: NEGATIVE
Nitrite: NEGATIVE
Protein, ur: NEGATIVE mg/dL
Specific Gravity, Urine: 1.01 (ref 1.005–1.030)
pH: 5 (ref 5.0–8.0)

## 2020-03-24 LAB — PREGNANCY, URINE: Preg Test, Ur: NEGATIVE

## 2020-03-24 LAB — URINALYSIS, MICROSCOPIC (REFLEX)

## 2020-03-24 MED ORDER — CYCLOBENZAPRINE HCL 10 MG PO TABS: 10.0000 mg | ORAL_TABLET | Freq: Two times a day (BID) | ORAL | 0 refills | Status: AC | PRN

## 2020-03-24 MED ORDER — IBUPROFEN 600 MG PO TABS: 600.0000 mg | ORAL_TABLET | Freq: Four times a day (QID) | ORAL | 0 refills | Status: AC | PRN
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
